# Patient Record
Sex: Male | Born: 1935 | Race: White | Hispanic: No | Marital: Married | Smoking: Never smoker
Health system: Southern US, Community
[De-identification: ages and names within clinical notes are randomized; demographics above are authoritative.]

## PROBLEM LIST (undated history)

## (undated) DIAGNOSIS — E119 Type 2 diabetes mellitus without complications: Secondary | ICD-10-CM

## (undated) DIAGNOSIS — I10 Essential (primary) hypertension: Secondary | ICD-10-CM

## (undated) HISTORY — PX: OTHER SURGICAL HISTORY: SHX169

---

## 2005-07-12 ENCOUNTER — Ambulatory Visit: Payer: Self-pay | Admitting: Internal Medicine

## 2005-07-29 ENCOUNTER — Ambulatory Visit: Payer: Self-pay | Admitting: Physician Assistant

## 2005-08-10 ENCOUNTER — Emergency Department: Payer: Self-pay | Admitting: Emergency Medicine

## 2006-07-01 ENCOUNTER — Ambulatory Visit: Payer: Self-pay | Admitting: Gastroenterology

## 2009-04-08 ENCOUNTER — Inpatient Hospital Stay: Payer: Self-pay | Admitting: Internal Medicine

## 2009-05-10 ENCOUNTER — Ambulatory Visit: Payer: Self-pay | Admitting: Internal Medicine

## 2009-06-27 ENCOUNTER — Ambulatory Visit: Payer: Self-pay | Admitting: Internal Medicine

## 2009-07-05 ENCOUNTER — Inpatient Hospital Stay: Payer: Self-pay | Admitting: Internal Medicine

## 2009-07-07 ENCOUNTER — Inpatient Hospital Stay: Payer: Self-pay | Admitting: Internal Medicine

## 2009-07-25 ENCOUNTER — Ambulatory Visit: Payer: Self-pay | Admitting: Internal Medicine

## 2011-01-25 ENCOUNTER — Ambulatory Visit: Payer: Self-pay | Admitting: Vascular Surgery

## 2011-02-21 ENCOUNTER — Inpatient Hospital Stay: Payer: Self-pay | Admitting: Vascular Surgery

## 2011-02-24 ENCOUNTER — Emergency Department: Payer: Self-pay | Admitting: Internal Medicine

## 2011-04-08 ENCOUNTER — Emergency Department: Payer: Self-pay | Admitting: Emergency Medicine

## 2013-03-02 ENCOUNTER — Ambulatory Visit: Payer: Self-pay | Admitting: Internal Medicine

## 2014-07-05 ENCOUNTER — Emergency Department: Payer: Self-pay | Admitting: Emergency Medicine

## 2014-12-19 ENCOUNTER — Emergency Department
Admission: EM | Admit: 2014-12-19 | Discharge: 2014-12-19 | Disposition: A | Discharge status: Home / Self Care | Payer: Medicare Other | Attending: Emergency Medicine | Admitting: Emergency Medicine

## 2014-12-19 ENCOUNTER — Encounter: Payer: Self-pay | Admitting: Emergency Medicine

## 2014-12-19 ENCOUNTER — Emergency Department: Payer: Medicare Other

## 2014-12-19 DIAGNOSIS — Y9289 Other specified places as the place of occurrence of the external cause: Secondary | ICD-10-CM | POA: Insufficient documentation

## 2014-12-19 DIAGNOSIS — S01111A Laceration without foreign body of right eyelid and periocular area, initial encounter: Secondary | ICD-10-CM | POA: Diagnosis not present

## 2014-12-19 DIAGNOSIS — S0990XA Unspecified injury of head, initial encounter: Secondary | ICD-10-CM | POA: Diagnosis present

## 2014-12-19 DIAGNOSIS — W01198A Fall on same level from slipping, tripping and stumbling with subsequent striking against other object, initial encounter: Secondary | ICD-10-CM | POA: Insufficient documentation

## 2014-12-19 DIAGNOSIS — S022XXA Fracture of nasal bones, initial encounter for closed fracture: Secondary | ICD-10-CM | POA: Insufficient documentation

## 2014-12-19 DIAGNOSIS — Y9389 Activity, other specified: Secondary | ICD-10-CM | POA: Diagnosis not present

## 2014-12-19 DIAGNOSIS — Y998 Other external cause status: Secondary | ICD-10-CM | POA: Insufficient documentation

## 2014-12-19 DIAGNOSIS — IMO0002 Reserved for concepts with insufficient information to code with codable children: Secondary | ICD-10-CM

## 2014-12-19 HISTORY — DX: Type 2 diabetes mellitus without complications: E11.9

## 2014-12-19 HISTORY — DX: Essential (primary) hypertension: I10

## 2014-12-19 MED ORDER — OXYCODONE-ACETAMINOPHEN 5-325 MG PO TABS: 1.0000 | ORAL_TABLET | Freq: Four times a day (QID) | ORAL | Status: DC | PRN

## 2014-12-19 MED ORDER — LIDOCAINE-EPINEPHRINE (PF) 1 %-1:200000 IJ SOLN
INTRAMUSCULAR | Status: AC
  Administered 2014-12-19: 30 mL
  Filled 2014-12-19: qty 30

## 2014-12-19 MED ORDER — ACETAMINOPHEN 500 MG PO TABS
1000.0000 mg | ORAL_TABLET | Freq: Once | ORAL | Status: AC
  Administered 2014-12-19: 1000 mg via ORAL
  Filled 2014-12-19: qty 2

## 2014-12-19 NOTE — ED Notes (Signed)
Pt states he was getting up from table and fell. Pt has laceration across nose and above right eye. Pt does complain of neck pain. Pt denies dizziness, or LOC.

## 2014-12-19 NOTE — Discharge Instructions (Signed)
Head Injury °You have received a head injury. It does not appear serious at this time. Headaches and vomiting are common following head injury. It should be easy to awaken from sleeping. Sometimes it is necessary for you to stay in the emergency department for a while for observation. Sometimes admission to the hospital may be needed. After injuries such as yours, most problems occur within the first 24 hours, but side effects may occur up to 7-10 days after the injury. It is important for you to carefully monitor your condition and contact your health care provider or seek immediate medical care if there is a change in your condition. °WHAT ARE THE TYPES OF HEAD INJURIES? °Head injuries can be as minor as a bump. Some head injuries can be more severe. More severe head injuries include: °· A jarring injury to the brain (concussion). °· A bruise of the brain (contusion). This mean there is bleeding in the brain that can cause swelling. °· A cracked skull (skull fracture). °· Bleeding in the brain that collects, clots, and forms a bump (hematoma). °WHAT CAUSES A HEAD INJURY? °A serious head injury is most likely to happen to someone who is in a car wreck and is not wearing a seat belt. Other causes of major head injuries include bicycle or motorcycle accidents, sports injuries, and falls. °HOW ARE HEAD INJURIES DIAGNOSED? °A complete history of the event leading to the injury and your current symptoms will be helpful in diagnosing head injuries. Many times, pictures of the brain, such as CT or MRI are needed to see the extent of the injury. Often, an overnight hospital stay is necessary for observation.  °WHEN SHOULD I SEEK IMMEDIATE MEDICAL CARE?  °You should get help right away if: °· You have confusion or drowsiness. °· You feel sick to your stomach (nauseous) or have continued, forceful vomiting. °· You have dizziness or unsteadiness that is getting worse. °· You have severe, continued headaches not relieved by  medicine. Only take over-the-counter or prescription medicines for pain, fever, or discomfort as directed by your health care provider. °· You do not have normal function of the arms or legs or are unable to walk. °· You notice changes in the black spots in the center of the colored part of your eye (pupil). °· You have a clear or bloody fluid coming from your nose or ears. °· You have a loss of vision. °During the next 24 hours after the injury, you must stay with someone who can watch you for the warning signs. This person should contact local emergency services (911 in the [place] if you have seizures, you become unconscious, or you are unable to wake up. °HOW CAN I PREVENT A HEAD INJURY IN THE FUTURE? °The most important factor for preventing major head injuries is avoiding motor vehicle accidents.  To minimize the potential for damage to your head, it is crucial to wear seat belts while riding in motor vehicles. Wearing helmets while bike riding and playing collision sports (like football) is also helpful. Also, avoiding dangerous activities around the house will further help reduce your risk of head injury.  °WHEN CAN I RETURN TO NORMAL ACTIVITIES AND ATHLETICS? °You should be reevaluated by your health care provider before returning to these activities. If you have any of the following symptoms, you should not return to activities or contact sports until 1 week after the symptoms have stopped: °· Persistent headache. °· Dizziness or vertigo. °· Poor attention and concentration. °· Confusion. °·   Memory problems.  Nausea or vomiting.  Fatigue or tire easily.  Irritability.  Intolerant of bright lights or loud noises.  Anxiety or depression.  Disturbed sleep. MAKE SURE YOU:   Understand these instructions.  Will watch your condition.  Will get help right away if you are not doing well or get worse. Document Released: 05/13/2005 Document Revised: 05/18/2013 Document Reviewed:  01/18/2013 [facility] Patient Information 2015 [place], [place]. This information is not intended to replace advice given to you by your health care provider. Make sure you discuss any questions you have with your health care provider.  Laceration Care, Adult A laceration is a cut that goes through all layers of the skin. The cut goes into the tissue beneath the skin. HOME CARE For stitches (sutures) or staples:  Keep the cut clean and dry.  If you have a bandage (dressing), change it at least once a day. Change the bandage if it gets wet or dirty, or as told by your doctor.  Wash the cut with soap and water 2 times a day. Rinse the cut with water. Pat it dry with a clean towel.  Put a thin layer of medicated cream on the cut as told by your doctor.  You may shower after the first 24 hours. Do not soak the cut in water until the stitches are removed.  Only take medicines as told by your doctor.  Have your stitches or staples removed as told by your doctor. For skin adhesive strips:  Keep the cut clean and dry.  Do not get the strips wet. You may take a bath, but be careful to keep the cut dry.  If the cut gets wet, pat it dry with a clean towel.  The strips will fall off on their own. Do not remove the strips that are still stuck to the cut. For wound glue:  You may shower or take baths. Do not soak or scrub the cut. Do not swim. Avoid heavy sweating until the glue falls off on its own. After a shower or bath, pat the cut dry with a clean towel.  Do not put medicine on your cut until the glue falls off.  If you have a bandage, do not put tape over the glue.  Avoid lots of sunlight or tanning lamps until the glue falls off. Put sunscreen on the cut for the first year to reduce your scar.  The glue will fall off on its own. Do not pick at the glue. You may need a tetanus shot if:  You cannot remember when you had your last tetanus shot.  You have never had a tetanus shot. If  you need a tetanus shot and you choose not to have one, you may get tetanus. Sickness from tetanus can be serious. GET HELP RIGHT AWAY IF:   Your pain does not get better with medicine.  Your arm, hand, leg, or foot loses feeling (numbness) or changes color.  Your cut is bleeding.  Your joint feels weak, or you cannot use your joint.  You have painful lumps on your body.  Your cut is red, puffy (swollen), or painful.  You have a red line on the skin near the cut.  You have yellowish-white fluid (pus) coming from the cut.  You have a fever.  You have a bad smell coming from the cut or bandage.  Your cut breaks open before or after stitches are removed.  You notice something coming out of the cut, such as  wood or glass.  You cannot move a finger or toe. MAKE SURE YOU:   Understand these instructions.  Will watch your condition.  Will get help right away if you are not doing well or get worse. Document Released: 10/30/2007 Document Revised: 08/05/2011 Document Reviewed: 11/06/2010 [facility] Patient Information 2015 [place], [place]. This information is not intended to replace advice given to you by your health care provider. Make sure you discuss any questions you have with your health care provider.  Nasal Fracture A nasal fracture is a break or crack in the bones of the nose. A minor break usually heals in a month. You often will receive black eyes from a nasal fracture. This is not a cause for concern. The black eyes will go away over 1 to 2 weeks.  DIAGNOSIS  Your caregiver may want to examine you if you are concerned about a fracture of the nose. X-rays of the nose may not show a nasal fracture even when one is present. Sometimes your caregiver must wait 1 to 5 days after the injury to re-check the nose for alignment and to take additional X-rays. Sometimes the caregiver must wait until the swelling has gone down. TREATMENT Minor fractures that have caused no deformity  often do not require treatment. More serious fractures where bones are displaced may require surgery. This will take place after the swelling is gone. Surgery will stabilize and align the fracture. HOME CARE INSTRUCTIONS   Put ice on the injured area.  Put ice in a plastic bag.  Place a towel between your skin and the bag.  Leave the ice on for 15-20 minutes, 03-04 times a day.  Take medications as directed by your caregiver.  Only take over-the-counter or prescription medicines for pain, discomfort, or fever as directed by your caregiver.  If your nose starts bleeding, squeeze the soft parts of the nose against the center wall while you are sitting in an upright position for 10 minutes.  Contact sports should be avoided for at least 3 to 4 weeks or as directed by your caregiver. SEEK MEDICAL CARE IF:  Your pain increases or becomes severe.  You continue to have nosebleeds.  The shape of your nose does not return to normal within 5 days.  You have pus draining from the nose. SEEK IMMEDIATE MEDICAL CARE IF:   You have bleeding from your nose that does not stop after 20 minutes of pinching the nostrils closed and keeping ice on the nose.  You have clear fluid draining from your nose.  You notice a grape-like swelling on the dividing wall between the nostrils (septum). This is a collection of blood (hematoma) that must be drained to help prevent infection.  You have difficulty moving your eyes.  You have recurrent vomiting. Document Released: 05/10/2000 Document Revised: 08/05/2011 Document Reviewed: 08/27/2010 [facility] Patient Information 2015 [place], [place]. This information is not intended to replace advice given to you by your health care provider. Make sure you discuss any questions you have with your health care provider.

## 2014-12-19 NOTE — ED Notes (Signed)
Cleaned wounds and dried blood on pt's face. Small laceration noted across bridge of nose, bleeding controlled. Sutures intact above right eye.

## 2014-12-19 NOTE — ED Provider Notes (Signed)
[facility] Emergency Department Provider Note     Time seen: ----------------------------------------- 7:34 PM on 12/19/2014 -----------------------------------------    I have reviewed the triage vital signs and the nursing notes.   HISTORY  Chief Complaint No chief complaint on file.    HPI Guy Hudson is a 81 y.o. male who presents ER for fall and head injury. States he was getting up from the table and fell hitting the washing machine. He has laceration noted across the nasal bridge and swelling about the right eye with laceration there as well. Patient does claim some neck pain. According to reports he states he fell and his glasses were driven into his foreheadand nose. Complaining of pain in the right supraorbital area and pain across the nasal bridge.   No past medical history on file.  There are no active problems to display for this patient.   No past surgical history on file.  Allergies Review of patient's allergies indicates not on file.  Social History History  Substance Use Topics  . Smoking status: Not on file  . Smokeless tobacco: Not on file  . Alcohol Use: Not on file    Review of Systems Constitutional: Negative for fever. Eyes: Negative for visual changes. ENT: Negative for sore throat. Cardiovascular: Negative for chest pain. Respiratory: Negative for shortness of breath. Gastrointestinal: Negative for abdominal pain, vomiting and diarrhea. Genitourinary: Negative for dysuria. Musculoskeletal: Negative for back pain. Positive for neck pain Skin: Negative for rash. Neurological: Positive for headache status post fall  10-point ROS otherwise negative.  ____________________________________________   PHYSICAL EXAM:  VITAL SIGNS: ED Triage Vitals  Enc Vitals Group     BP --      Pulse --      Resp --      Temp --      Temp src --      SpO2 --      Weight --      Height --      Head Cir --      Peak  Flow --      Pain Score --      Pain Loc --      Pain Edu? --      Excl. in [place]? --     Constitutional: Alert and oriented. Well appearing and in no distress. Eyes: Conjunctivae are normal. PERRL. Normal extraocular movements. ENT   Head: 3.5 similar laceration through the right eyebrow.   Nose: No congestion/rhinnorhea. Nose is swollen, no septal hematoma. Superficial laceration is noted over the nasal bridge just to the right of midline   Mouth/Throat: Mucous membranes are moist.   Neck: No stridor. Cardiovascular: Normal rate, regular rhythm. Normal and symmetric distal pulses are present in all extremities. No murmurs, rubs, or gallops. Respiratory: Normal respiratory effort without tachypnea nor retractions. Breath sounds are clear and equal bilaterally. No wheezes/rales/rhonchi. Gastrointestinal: Soft and nontender. No distention. Musculoskeletal: Nontender with normal range of motion in all extremities. No joint effusions.  No lower extremity tenderness nor edema. Neurologic:  Normal speech and language. No gross focal neurologic deficits are appreciated.  Skin: Laceration noted through right eyebrow as above, ecchymosis and superficial laceration noted around the nose Psychiatric: Mood and affect are normal.  ____________________________________________  ED COURSE:  Pertinent labs & imaging results that were available during my care of the patient were reviewed by me and considered in my medical decision making (see chart for details). Fall with facial injury and laceration.  Will need wound repair as well as CT scans ____________________________________________  RADIOLOGY Images were viewed by me  CT head, C-spine are unremarkable FINDINGS: There is a displaced distal nasal bone fracture. Patient is edentulous. Sinuses are clear.  IMPRESSION: Displaced nasal bone fracture.   LACERATION REPAIR Performed by: Earleen Newport Authorized by: Lenise Arena E Consent: Verbal consent obtained. Risks and benefits: risks, benefits and alternatives were discussed Consent given by: patient Patient identity confirmed: provided demographic data Prepped and Draped in normal sterile fashion Wound explored  Laceration Location: Right eyebrow  Laceration Length: 3.5 cm  No Foreign Bodies seen or palpated  Anesthesia: local infiltration  Local anesthetic: lidocaine 1 % with epinephrine  Anesthetic total: 3 ml  Irrigation method: syringe Amount of cleaning: standard  Skin closure: 5-0 Ethilon   Number of sutures: 11   Technique: Running   Patient tolerance: Patient tolerated the procedure well with no immediate complications. ____________________________________________  FINAL ASSESSMENT AND PLAN  Fall, head injury, frontal scalp laceration, nasal fracture  Plan: Patient with labs and imaging as dictated above. Patient be encouraged to use ice pack 15 minutes every hour for the next 24 hours. We'll discharge with pain medication and ENT follow-up. Sutures need to be removed in 7 days.   Earleen Newport, MD   Earleen Newport, MD 12/19/14 [phone]

## 2016-04-08 ENCOUNTER — Other Ambulatory Visit (INDEPENDENT_AMBULATORY_CARE_PROVIDER_SITE_OTHER): Payer: Self-pay | Admitting: Vascular Surgery

## 2016-04-08 DIAGNOSIS — I739 Peripheral vascular disease, unspecified: Secondary | ICD-10-CM

## 2016-04-08 DIAGNOSIS — I6523 Occlusion and stenosis of bilateral carotid arteries: Secondary | ICD-10-CM

## 2016-04-08 DIAGNOSIS — M79605 Pain in left leg: Secondary | ICD-10-CM

## 2016-04-08 DIAGNOSIS — M79604 Pain in right leg: Secondary | ICD-10-CM

## 2016-04-11 ENCOUNTER — Encounter (INDEPENDENT_AMBULATORY_CARE_PROVIDER_SITE_OTHER): Payer: Medicare Other

## 2016-04-11 ENCOUNTER — Ambulatory Visit (INDEPENDENT_AMBULATORY_CARE_PROVIDER_SITE_OTHER): Payer: Medicare Other | Admitting: Vascular Surgery

## 2016-05-12 ENCOUNTER — Emergency Department
Admission: EM | Admit: 2016-05-12 | Discharge: 2016-05-12 | Disposition: A | Discharge status: Home / Self Care | Payer: Medicare Other | Attending: Emergency Medicine | Admitting: Emergency Medicine

## 2016-05-12 ENCOUNTER — Emergency Department: Payer: Medicare Other

## 2016-05-12 DIAGNOSIS — Z794 Long term (current) use of insulin: Secondary | ICD-10-CM | POA: Diagnosis not present

## 2016-05-12 DIAGNOSIS — E119 Type 2 diabetes mellitus without complications: Secondary | ICD-10-CM | POA: Diagnosis not present

## 2016-05-12 DIAGNOSIS — Z79899 Other long term (current) drug therapy: Secondary | ICD-10-CM | POA: Diagnosis not present

## 2016-05-12 DIAGNOSIS — I10 Essential (primary) hypertension: Secondary | ICD-10-CM | POA: Insufficient documentation

## 2016-05-12 DIAGNOSIS — Z7982 Long term (current) use of aspirin: Secondary | ICD-10-CM | POA: Diagnosis not present

## 2016-05-12 DIAGNOSIS — K5900 Constipation, unspecified: Secondary | ICD-10-CM | POA: Diagnosis present

## 2016-05-12 LAB — COMPREHENSIVE METABOLIC PANEL
ALK PHOS: 852 U/L — AB (ref 38–126)
ALT: 15 U/L — ABNORMAL LOW (ref 17–63)
AST: 25 U/L (ref 15–41)
Albumin: 3.7 g/dL (ref 3.5–5.0)
Anion gap: 6 (ref 5–15)
BILIRUBIN TOTAL: 0.7 mg/dL (ref 0.3–1.2)
BUN: 32 mg/dL — AB (ref 6–20)
CALCIUM: 8.6 mg/dL — AB (ref 8.9–10.3)
CO2: 28 mmol/L (ref 22–32)
CREATININE: 2.36 mg/dL — AB (ref 0.61–1.24)
Chloride: 104 mmol/L (ref 101–111)
GFR calc Af Amer: 26 mL/min — ABNORMAL LOW (ref >60)
GFR calc non Af Amer: 23 mL/min — ABNORMAL LOW (ref >60)
Glucose, Bld: 132 mg/dL — ABNORMAL HIGH (ref 65–99)
Potassium: 4.4 mmol/L (ref 3.5–5.1)
Sodium: 138 mmol/L (ref 135–145)
Total Protein: 7.4 g/dL (ref 6.5–8.1)

## 2016-05-12 LAB — CBC WITH DIFFERENTIAL/PLATELET
BASOS ABS: 0 10*3/uL (ref 0–0.1)
Basophils Relative: 1 %
Eosinophils Absolute: 0.4 10*3/uL (ref 0–0.7)
Eosinophils Relative: 6 %
HEMATOCRIT: 32.1 % — AB (ref 40.0–52.0)
Hemoglobin: 10.9 g/dL — ABNORMAL LOW (ref 13.0–18.0)
Lymphocytes Relative: 16 %
Lymphs Abs: 1 10*3/uL (ref 1.0–3.6)
MCH: 31.4 pg (ref 26.0–34.0)
MCHC: 34 g/dL (ref 32.0–36.0)
MCV: 92.3 fL (ref 80.0–100.0)
Monocytes Absolute: 0.7 10*3/uL (ref 0.2–1.0)
Monocytes Relative: 11 %
NEUTROS ABS: 4.4 10*3/uL (ref 1.4–6.5)
Neutrophils Relative %: 66 %
Platelets: 184 10*3/uL (ref 150–440)
RBC: 3.47 MIL/uL — ABNORMAL LOW (ref 4.40–5.90)
RDW: 12.8 % (ref 11.5–14.5)
WBC: 6.6 10*3/uL (ref 3.8–10.6)

## 2016-05-12 LAB — GLUCOSE, CAPILLARY: Glucose-Capillary: 101 mg/dL — ABNORMAL HIGH (ref 65–99)

## 2016-05-12 MED ORDER — SORBITOL 70 % SOLN
960.0000 mL | TOPICAL_OIL | Freq: Once | ORAL | Status: DC
  Filled 2016-05-12: qty 240

## 2016-05-12 MED ORDER — SODIUM CHLORIDE 0.9 % IV BOLUS (SEPSIS)
500.0000 mL | Freq: Once | INTRAVENOUS | Status: AC
  Administered 2016-05-12: 500 mL via INTRAVENOUS

## 2016-05-12 MED ORDER — POLYETHYLENE GLYCOL 3350 17 G PO PACK: 17.0000 g | PACK | Freq: Every day | ORAL | 0 refills | Status: DC

## 2016-05-12 NOTE — ED Notes (Signed)
Soap suds enema completed.

## 2016-05-12 NOTE — ED Triage Notes (Signed)
Pt came to ED via EMS from home c/o constipation for 2-3 days. Seen by PCP, given laxatives but pt reports nothing is working.Denies pain.

## 2016-05-12 NOTE — Discharge Instructions (Signed)
Please return immediately if condition worsens. Please contact her primary physician or the physician you were given for referral. If you have any specialist physicians involved in her treatment and plan please also contact them. Thank you for using [place] [facility]. ° °

## 2016-05-12 NOTE — ED Notes (Signed)
Pt given snack and juice.

## 2016-05-12 NOTE — ED Notes (Signed)
Pt had large BM and urinated without difficulty.

## 2016-05-12 NOTE — ED Provider Notes (Signed)
Time Seen: Approximately [id number]  I have reviewed the triage notes  Chief Complaint: Constipation   History of Present Illness: Guy Hudson is a 82 y.o. male *who presents with a 2 day history of constipation. Patient was transported here by EMS. He has no pain at this time. He took some laxatives at home without a bowel movement. He states he's been urinating normally without difficulty initiating order him stopping his urination. He denies any weakness in either upper or lower extremities. No fever at home. Past Medical History:  Diagnosis Date  . Diabetes mellitus without complication ([place])   . Hypertension     There are no active problems to display for this patient.   History reviewed. No pertinent surgical history.  History reviewed. No pertinent surgical history.  Current Outpatient Rx  . Order #: [id number] Class: Historical Med  . Order #: [id number] Class: Historical Med  . Order #: [id number] Class: Historical Med  . Order #: [id number] Class: Historical Med  . Order #: [id number] Class: Historical Med  . Order #: [id number] Class: Historical Med  . Order #: [id number] Class: Historical Med  . Order #: [id number] Class: Print  . Order #: [id number] Class: Historical Med  . Order #: [id number] Class: Historical Med    Allergies:  Patient has no known allergies.  Family History: No family history on file.  Social History: Social History  Substance Use Topics  . Smoking status: Never Smoker  . Smokeless tobacco: Not on file  . Alcohol use No     Review of Systems:   10 point review of systems was performed and was otherwise negative:  Constitutional: No fever Eyes: No visual disturbances ENT: No sore throat, ear pain Cardiac: No chest pain Respiratory: No shortness of breath, wheezing, or stridor Abdomen: No abdominal pain, no vomiting, No diarrhea Constipation with urgency to have a bowel movement but none successful Endocrine: No weight loss, No night  sweats Extremities: No peripheral edema, cyanosis Skin: No rashes, easy bruising Neurologic: No focal weakness, trouble with speech or swollowing Urologic: No dysuria, Hematuria, or urinary frequency   Physical Exam:  ED Triage Vitals [05/12/16 1034]  Enc Vitals Group     BP (!) 169/62     Pulse Rate 83     Resp      Temp 99.1 F (37.3 C)     Temp Source Oral     SpO2 99 %     Weight      Height      Head Circumference      Peak Flow      Pain Score      Pain Loc      Pain Edu?      Excl. in [place]?     General: Awake , Alert , and Oriented times 3; GCS 15 Head: Normal cephalic , atraumatic Eyes: Pupils equal , round, reactive to light Nose/Throat: No nasal drainage, patent upper airway without erythema or exudate.  Neck: Supple, Full range of motion, No anterior adenopathy or palpable thyroid masses Lungs: Clear to ascultation without wheezes , rhonchi, or rales Heart: Regular rate, regular rhythm without murmurs , gallops , or rubs Abdomen: Soft, non tender without rebound, guarding , or rigidity; bowel sounds positive and symmetric in all 4 quadrants. No organomegaly .        Extremities: 2 plus symmetric pulses. No edema, clubbing or cyanosis Neurologic: normal ambulation, Motor symmetric without deficits, sensory intact Skin: warm, dry, no rashes   Labs:   All laboratory work  was reviewed including any pertinent negatives or positives listed below:  Labs Reviewed  CBC WITH DIFFERENTIAL/PLATELET  COMPREHENSIVE METABOLIC PANEL  Patient has some elevated creatinine level. He also has an elevated alkaline phosphatase level.  Radiology: * "Dg Abd Acute W/chest  Result Date: 05/12/2016 CLINICAL DATA:  Constipation for the past 2 days. EXAM: DG ABDOMEN ACUTE W/ 1V CHEST COMPARISON:  Chest radiographs dated 07/10/2009. Abdomen and pelvis CT dated 07/07/2009. Pelvis CT dated 04/08/2011. FINDINGS: The cardiac silhouette remains borderline enlarged. Stable mild linear  scarring at the right lateral lung base. Mild biapical pleural and parenchymal scarring. Otherwise, clear lungs. Normal bowel gas pattern without free peritoneal air. Interval 1.9 cm rounded sclerotic focus in the right iliac wing. There is also interval sclerosis of the L1 vertebral body and ill-defined sclerosis in the left superior acetabular region of the left iliac bone. Also noted are lumbar and lower thoracic spine degenerative changes. IMPRESSION: 1. Interval sclerotic bone lesions, suspicious for sclerotic metastatic disease. This can be seen with prostate cancer. 2. No acute abnormality. Electronically Signed   By: Claudie Revering M.D.   On: 05/12/2016 11:31  "  I personally reviewed the radiologic studies    ED Course:  Patient's stay here was uneventful and showed symptomatic improvement with a soapsuds enema. He had a large bowel movement without any blood, etc. I reviewed his laboratory work with his family is at the bedside and apparently his renal function decline is noted by the primary physician and the family states he is "" watching it "". Patient also has some findings per x-ray and laboratory for concerns for prostate cancer. Family states that this is also been reviewed with him and patient is not going to likely have any aggressive management. He was given some IV fluids here which I felt may help him with the constipation and he was discharged home with his family with close follow-up with his primary physician who seems to be aware of most of his current medical issues. I placed the patient on MiraLAX on a daily basis as an outpatient. Clinical Course      Assessment:  Acute nonneurologic constipation Renal insufficiency Likely prostate cancer      Plan:  Outpatient \" New Prescriptions   POLYETHYLENE GLYCOL (MIRALAX) PACKET    Take 17 g by mouth daily.  " Patient was advised to return immediately if condition worsens. Patient was advised to follow up with their  primary care physician or other specialized physicians involved in their outpatient care. The patient and/or family member/power of attorney had laboratory results reviewed at the bedside. All questions and concerns were addressed and appropriate discharge instructions were distributed by the nursing staff.            Daymon Larsen, MD 05/12/16 [phone]

## 2016-05-30 ENCOUNTER — Emergency Department: Payer: Medicare Other

## 2016-05-30 ENCOUNTER — Encounter: Payer: Self-pay | Admitting: Emergency Medicine

## 2016-05-30 ENCOUNTER — Inpatient Hospital Stay
Admission: EM | Admit: 2016-05-30 | Discharge: 2016-06-07 | DRG: 871 | Disposition: A | Discharge status: Hospice | Payer: Medicare Other | Attending: Internal Medicine | Admitting: Internal Medicine

## 2016-05-30 DIAGNOSIS — R4182 Altered mental status, unspecified: Secondary | ICD-10-CM | POA: Diagnosis not present

## 2016-05-30 DIAGNOSIS — A419 Sepsis, unspecified organism: Secondary | ICD-10-CM | POA: Diagnosis present

## 2016-05-30 DIAGNOSIS — Z7189 Other specified counseling: Secondary | ICD-10-CM

## 2016-05-30 DIAGNOSIS — Z79899 Other long term (current) drug therapy: Secondary | ICD-10-CM

## 2016-05-30 DIAGNOSIS — Z794 Long term (current) use of insulin: Secondary | ICD-10-CM

## 2016-05-30 DIAGNOSIS — E785 Hyperlipidemia, unspecified: Secondary | ICD-10-CM | POA: Diagnosis present

## 2016-05-30 DIAGNOSIS — Z79891 Long term (current) use of opiate analgesic: Secondary | ICD-10-CM

## 2016-05-30 DIAGNOSIS — R6520 Severe sepsis without septic shock: Secondary | ICD-10-CM | POA: Diagnosis present

## 2016-05-30 DIAGNOSIS — Z8042 Family history of malignant neoplasm of prostate: Secondary | ICD-10-CM

## 2016-05-30 DIAGNOSIS — N400 Enlarged prostate without lower urinary tract symptoms: Secondary | ICD-10-CM | POA: Diagnosis present

## 2016-05-30 DIAGNOSIS — C61 Malignant neoplasm of prostate: Secondary | ICD-10-CM

## 2016-05-30 DIAGNOSIS — R509 Fever, unspecified: Secondary | ICD-10-CM

## 2016-05-30 DIAGNOSIS — N189 Chronic kidney disease, unspecified: Secondary | ICD-10-CM | POA: Diagnosis present

## 2016-05-30 DIAGNOSIS — N419 Inflammatory disease of prostate, unspecified: Secondary | ICD-10-CM | POA: Diagnosis present

## 2016-05-30 DIAGNOSIS — M549 Dorsalgia, unspecified: Secondary | ICD-10-CM

## 2016-05-30 DIAGNOSIS — L89152 Pressure ulcer of sacral region, stage 2: Secondary | ICD-10-CM

## 2016-05-30 DIAGNOSIS — Z515 Encounter for palliative care: Secondary | ICD-10-CM

## 2016-05-30 DIAGNOSIS — E1122 Type 2 diabetes mellitus with diabetic chronic kidney disease: Secondary | ICD-10-CM | POA: Diagnosis present

## 2016-05-30 DIAGNOSIS — Z7982 Long term (current) use of aspirin: Secondary | ICD-10-CM

## 2016-05-30 DIAGNOSIS — N179 Acute kidney failure, unspecified: Secondary | ICD-10-CM | POA: Diagnosis present

## 2016-05-30 DIAGNOSIS — R2681 Unsteadiness on feet: Secondary | ICD-10-CM

## 2016-05-30 DIAGNOSIS — I129 Hypertensive chronic kidney disease with stage 1 through stage 4 chronic kidney disease, or unspecified chronic kidney disease: Secondary | ICD-10-CM | POA: Diagnosis present

## 2016-05-30 DIAGNOSIS — C7951 Secondary malignant neoplasm of bone: Secondary | ICD-10-CM | POA: Diagnosis present

## 2016-05-30 DIAGNOSIS — R310 Gross hematuria: Secondary | ICD-10-CM

## 2016-05-30 DIAGNOSIS — Z66 Do not resuscitate: Secondary | ICD-10-CM

## 2016-05-30 DIAGNOSIS — B349 Viral infection, unspecified: Secondary | ICD-10-CM | POA: Diagnosis present

## 2016-05-30 DIAGNOSIS — K5900 Constipation, unspecified: Secondary | ICD-10-CM | POA: Diagnosis present

## 2016-05-30 DIAGNOSIS — L8990 Pressure ulcer of unspecified site, unspecified stage: Secondary | ICD-10-CM | POA: Insufficient documentation

## 2016-05-30 DIAGNOSIS — R0902 Hypoxemia: Secondary | ICD-10-CM | POA: Diagnosis present

## 2016-05-30 DIAGNOSIS — E860 Dehydration: Secondary | ICD-10-CM | POA: Diagnosis present

## 2016-05-30 DIAGNOSIS — G9341 Metabolic encephalopathy: Secondary | ICD-10-CM | POA: Diagnosis present

## 2016-05-30 LAB — CBC
HCT: 33 % — ABNORMAL LOW (ref 40.0–52.0)
Hemoglobin: 11.3 g/dL — ABNORMAL LOW (ref 13.0–18.0)
MCH: 31.9 pg (ref 26.0–34.0)
MCHC: 34.2 g/dL (ref 32.0–36.0)
MCV: 93.3 fL (ref 80.0–100.0)
PLATELETS: 142 10*3/uL — AB (ref 150–440)
RBC: 3.53 MIL/uL — ABNORMAL LOW (ref 4.40–5.90)
RDW: 13.5 % (ref 11.5–14.5)
WBC: 8.3 10*3/uL (ref 3.8–10.6)

## 2016-05-30 MED ORDER — LORAZEPAM 2 MG/ML IJ SOLN
INTRAMUSCULAR | Status: AC
  Administered 2016-05-30: 1 mg
  Filled 2016-05-30: qty 1

## 2016-05-30 MED ORDER — SODIUM CHLORIDE 0.9 % IV BOLUS (SEPSIS)
1000.0000 mL | Freq: Once | INTRAVENOUS | Status: AC
  Administered 2016-05-30: 1000 mL via INTRAVENOUS

## 2016-05-30 MED ORDER — VANCOMYCIN HCL IN DEXTROSE 1-5 GM/200ML-% IV SOLN
1000.0000 mg | Freq: Once | INTRAVENOUS | Status: AC
  Administered 2016-05-30: 1000 mg via INTRAVENOUS
  Filled 2016-05-30: qty 200

## 2016-05-30 MED ORDER — SODIUM CHLORIDE 0.9 % IV BOLUS (SEPSIS)
500.0000 mL | Freq: Once | INTRAVENOUS | Status: AC
  Administered 2016-05-31: 500 mL via INTRAVENOUS

## 2016-05-30 MED ORDER — ACETAMINOPHEN 650 MG RE SUPP
RECTAL | Status: AC
  Administered 2016-05-30: 650 mg
  Filled 2016-05-30: qty 1

## 2016-05-30 MED ORDER — ACETAMINOPHEN 650 MG RE SUPP
650.0000 mg | Freq: Once | RECTAL | Status: AC
  Administered 2016-05-30: 650 mg via RECTAL

## 2016-05-30 MED ORDER — PIPERACILLIN-TAZOBACTAM 3.375 G IVPB 30 MIN
3.3750 g | Freq: Once | INTRAVENOUS | Status: AC
  Administered 2016-05-30: 3.375 g via INTRAVENOUS
  Filled 2016-05-30: qty 50

## 2016-05-30 NOTE — ED Notes (Signed)
Pt brought in via ems from home with altered mental status.  Pt yelling out on arrival to room.  Pt has fever of 103 on arrival  Code sepsis called by dr brown.  2 iv's started stat ,foley cath inserted and meds given.

## 2016-05-30 NOTE — Progress Notes (Signed)
Pharmacy Antibiotic Note  Guy Hudson is a 83 y.o. male admitted on 05/30/2016 with sepsis.  Pharmacy has been consulted for Zosyn and vancomycin dosing.  Plan: 1. Zosyn 3.375 gm IV Q8H EI 2. Vancomycin 1 gm IV x 1 in ED followed in approximately 12 hours (stacked dosing) by vancomycin 1 gm IV Q36H, predicted trough 15 mcg/mL. Pharmacy will continue to follow and adjust as needed to maintain trough 15 to 20 mcg/mL.   Vd 53.5 L, Ke 0.022 hr-1, T1/2 31.2 hr  Height: 5\' 10"  (177.8 cm) Weight: 180 lb (81.6 kg) IBW/kg (Calculated) : 73  Temp (24hrs), Avg:103.3 F (39.6 C), Min:103.3 F (39.6 C), Max:103.3 F (39.6 C)  No results for input(s): WBC, CREATININE, LATICACIDVEN, VANCOTROUGH, VANCOPEAK, VANCORANDOM, GENTTROUGH, GENTPEAK, GENTRANDOM, TOBRATROUGH, TOBRAPEAK, TOBRARND, AMIKACINPEAK, AMIKACINTROU, AMIKACIN in the last 168 hours.  Estimated Creatinine Clearance: 21.5 mL/min (by C-G formula based on SCr of 2.36 mg/dL (H)).    No Known Allergies  Thank you for allowing pharmacy to be a part of this patient's care.  Laural Benes, Pharm.D., BCPS Clinical Pharmacist 05/30/2016 11:35 PM

## 2016-05-30 NOTE — ED Notes (Signed)
Patient transported to CT 

## 2016-05-30 NOTE — ED Provider Notes (Signed)
[facility] Emergency Department Provider Note  ____________________________________________   First MD Initiated Contact with Patient 05/30/16 2316     (approximate)  I have reviewed the triage vital signs and the nursing notes.  History and physical exam limited SECONDARY to altered mental status. HISTORY  Chief Complaint Altered Mental Status   HPI Guy Hudson is a 83 y.o. male resents emergency department via EMS with altered mental status with abrupt onset approximately one hour ago per EMS staff. Able to obtain meaningful history from patient due to altered mental status. Of note patient found to be tachycardic, hypoxic febrile on arrival to the emergency department.   Past Medical History:  Diagnosis Date  . Diabetes mellitus without complication ([place])   . Hypertension     Patient Active Problem List   Diagnosis Date Noted  . Sepsis ([place]) 05/31/2016    Past Surgical History:  Procedure Laterality Date  . unknown      Prior to Admission medications   Medication Sig Start Date End Date Taking? Authorizing Provider  acetaminophen (TYLENOL) 500 MG tablet Take 500-1,000 mg by mouth every 6 (six) hours as needed for mild pain or headache.    Historical Provider, MD  aspirin EC 325 MG tablet Take 325 mg by mouth daily.    Historical Provider, MD  ferrous sulfate 325 (65 FE) MG tablet Take 325 mg by mouth 3 (three) times a week. Pt takes on Monday, Wednesday, and Friday.    Historical Provider, MD  furosemide (LASIX) 40 MG tablet Take 40 mg by mouth daily. Pt does not take on Monday, Wednesday, and Friday.    Historical Provider, MD  insulin aspart (NOVOLOG) 100 UNIT/ML injection Inject 8 Units into the skin 2 (two) times daily.    Historical Provider, MD  insulin detemir (LEVEMIR) 100 UNIT/ML injection Inject 35 Units into the skin 2 (two) times daily.    Historical Provider, MD  metoprolol (LOPRESSOR) 50 MG tablet Take 50 mg by mouth 2 (two)  times daily.    Historical Provider, MD  oxyCODONE-acetaminophen (ROXICET) 5-325 MG per tablet Take 1 tablet by mouth every 6 (six) hours as needed. 12/19/14   Earleen Newport, MD  polyethylene glycol [facility]) packet Take 17 g by mouth daily. 05/12/16   Daymon Larsen, MD  simvastatin (ZOCOR) 80 MG tablet Take 40 mg by mouth at bedtime.    Historical Provider, MD  terazosin (HYTRIN) 5 MG capsule Take 5 mg by mouth at bedtime.    Historical Provider, MD    Allergies Patient has no known allergies.  Family History  Problem Relation Age of Onset  . Family history unknown: Yes    Social History Social History  Substance Use Topics  . Smoking status: Never Smoker  . Smokeless tobacco: Never Used  . Alcohol use No    Review of Systems Constitutional: Positive for fever/chills Eyes: No visual changes. ENT: No sore throat. Cardiovascular: Denies chest pain. Respiratory: Denies shortness of breath. Gastrointestinal: No abdominal pain.  No nausea, no vomiting.  No diarrhea.  No constipation. Genitourinary: Negative for dysuria. Musculoskeletal: Negative for back pain. Skin: Negative for rash. Neurological: Negative for headaches, focal weakness or numbness.  10-point ROS otherwise negative.  ____________________________________________   PHYSICAL EXAM:  VITAL SIGNS: ED Triage Vitals  Enc Vitals Group     BP --      Pulse --      Resp --      Temp 05/30/16 2323 Marland Kitchen)  103.3 F (39.6 C)     Temp Source 05/30/16 2323 Rectal     SpO2 --      Weight 05/30/16 2316 180 lb (81.6 kg)     Height 05/30/16 2316 5\' 10"  (1.778 m)     Head Circumference --      Peak Flow --      Pain Score --      Pain Loc --      Pain Edu? --      Excl. in [place]? --    Constitutional: Agitated, combative.  Eyes: Conjunctivae are normal. PERRL. EOMI. Head: Atraumatic. Ears:  Healthy appearing ear canals and TMs bilaterally Nose: No congestion/rhinnorhea. Mouth/Throat: Mucous membranes are  moist.  Oropharynx non-erythematous. Neck: No stridor.   Cardiovascular: Normal rate, regular rhythm. Good peripheral circulation. Grossly normal heart sounds. Respiratory: Normal respiratory effort.  No retractions. Lungs CTAB. Gastrointestinal: Soft and nontender. No distention.  Musculoskeletal: No lower extremity tenderness nor edema. No gross deformities of extremities. Neurologic:  Nonsensical repetitive speech No gross focal neurologic deficits are appreciated.  Skin:  Skin is to touch. No rash noted. Psychiatric: Mood and affect are normal. Speech and behavior are normal.  ____________________________________________   LABS (all labs ordered are listed, but only abnormal results are displayed)  Labs Reviewed  BASIC METABOLIC PANEL - Abnormal; Notable for the following:       Result Value   Glucose, Bld 254 (*)    BUN 40 (*)    Creatinine, Ser 2.30 (*)    Calcium 8.2 (*)    GFR calc non Af Amer 23 (*)    GFR calc Af Amer 27 (*)    All other components within normal limits  CBC - Abnormal; Notable for the following:    RBC 3.53 (*)    Hemoglobin 11.3 (*)    HCT 33.0 (*)    Platelets 142 (*)    All other components within normal limits  TROPONIN I - Abnormal; Notable for the following:    Troponin I 0.03 (*)    All other components within normal limits  URINALYSIS, COMPLETE (UACMP) WITH MICROSCOPIC - Abnormal; Notable for the following:    Color, Urine YELLOW (*)    APPearance CLEAR (*)    Glucose, UA >=500 (*)    Protein, ur 30 (*)    All other components within normal limits  RAPID INFLUENZA A&B ANTIGENS ([facility] ONLY)  CULTURE, BLOOD (ROUTINE X 2)  CULTURE, BLOOD (ROUTINE X 2)  URINE CULTURE  LACTIC ACID, PLASMA  LACTIC ACID, PLASMA  BLOOD GAS, VENOUS  BASIC METABOLIC PANEL  CBC   ____________________________________________  EKG  ED ECG REPORT I, [place] Mack Alvidrez, the attending physician, personally viewed and interpreted this ECG.   Date: 05/30/2016   EKG Time: 11:27 PM  Rate: 106  Rhythm: Sinus tachycardia  Axis: Normal  Intervals: Normal  ST&T Change: None  ____________________________________________  RADIOLOGY I, [employer] N Jhamal Plucinski, personally viewed and evaluated these images (plain radiographs) as part of my medical decision making, as well as reviewing the written report by the radiologist.  Ct Head Wo Contrast  Result Date: 05/31/2016 CLINICAL DATA:  Altered mental status beginning at 2200 hours tonight after sneezing. Hyperglycemia. History of hypertension and diabetes. EXAM: CT HEAD WITHOUT CONTRAST TECHNIQUE: Contiguous axial images were obtained from the base of the skull through the vertex without intravenous contrast. COMPARISON:  CT HEAD December 19, 2014 FINDINGS: BRAIN: The ventricles and sulci are normal for age. No intraparenchymal  hemorrhage, mass effect nor midline shift. Patchy supratentorial white matter hypodensities less than expected for patient's age, though non-specific are most compatible with chronic small vessel ischemic disease. No acute large vascular territory infarcts. No abnormal extra-axial fluid collections. Basal cisterns are patent. VASCULAR: Mild to moderate calcific atherosclerosis of the carotid siphons. SKULL: No skull fracture. No significant scalp soft tissue swelling. SINUSES/ORBITS: The mastoid air-cells and included paranasal sinuses are well-aerated. Status post bilateral ocular lens implants. The included ocular globes and orbital contents are non-suspicious. OTHER: Patient is edentulous. IMPRESSION: Negative CT HEAD for age. Electronically Signed   By: Elon Alas M.D.   On: 05/31/2016 00:08   Ct Chest Wo Contrast  Result Date: 05/31/2016 CLINICAL DATA:  Hematuria. Altered mental status after sneezing tonight. Hyperglycemia. History of hypertension, diabetes. EXAM: CT CHEST, ABDOMEN AND PELVIS WITHOUT CONTRAST TECHNIQUE: Multidetector CT imaging of the chest, abdomen and pelvis was performed  following the standard protocol without IV contrast. COMPARISON:  Chest radiograph May 30, 2016 and acute abdomen series May 12, 2016 and CT pelvis April 08, 2011 FINDINGS: CT CHEST FINDINGS CARDIOVASCULAR: Heart size is normal. Mild coronary artery calcifications. No pericardial effusions. Thoracic aorta is normal course and caliber, mild calcific atherosclerosis of the aortic arch. MEDIASTINUM/NODES: No mediastinal mass. Scattered subcentimeter mediastinal lymph nodes without lymphadenopathy by CT size criteria. Normal appearance of thoracic esophagus though not tailored for evaluation. LUNGS/PLEURA: Tracheobronchial tree is patent, no pneumothorax. Mild bronchial wall thickening. No pleural effusions, focal consolidations, pulmonary nodules or masses. RIGHT upper lobe atelectasis MUSCULOSKELETAL: Scattered sclerotic lesions throughout the thoracic spine, most conspicuous within spinous process T2, manubrium, and ventral T9 and T10 vertebral bodies without pathologic fracture. Sclerotic lesion RIGHT T10 rib. Soft tissues are normal. CT ABDOMEN PELVIS FINDINGS HEPATOBILIARY: Punctate calcified granuloma, liver otherwise unremarkable. Normal gallbladder. PANCREAS: Normal. SPLEEN: Normal. ADRENALS/URINARY TRACT: Kidneys are orthotopic, demonstrating normal size and morphology. No nephrolithiasis, hydronephrosis; limited assessment for renal masses on this nonenhanced examination. 4.8 cm RIGHT interpolar cyst Tom RIGHT lower pole exophytic 16 mm cyst. Pedunculated 6.2 cm cyst exophytic from lower pole LEFT kidney. The unopacified ureters are normal in course and caliber. Urinary bladder is decompressed containing Foley catheter in retaining bulb. Normal adrenal glands. STOMACH/BOWEL: The stomach, small and large bowel are normal in course and caliber without inflammatory changes, sensitivity decreased by lack of enteric contrast. VASCULAR/LYMPHATIC: Aortoiliac vessels are normal in course and caliber, mild  calcific atherosclerosis. No lymphadenopathy by CT size criteria. REPRODUCTIVE: Prostatomegaly, 5.1 x 4.7 cm invades the base the urinary bladder. OTHER: No intraperitoneal free fluid or free air. MUSCULOSKELETAL: Patchy sclerosis bilateral iliac bones, RIGHT intertrochanteric femur, L1 ivory vertebral body with additional patchy sclerosis in the lumbosacral spine. No pathologic fracture. Severe L4-5 and L5-S1 degenerative discs. Moderate fat containing inguinal hernias. IMPRESSION: CT CHEST: Mild bronchial wall thickening associated with bronchitis or reactive airway disease without pneumonia. Sclerotic osseous metastasis without pathologic fracture, most commonly seen with prostate cancer. CT ABDOMEN AND PELVIS:  No acute abdominopelvic process. Prostatomegaly invading urinary bladder. Sclerotic osseous metastasis without pathologic fracture, most commonly seen with prostate cancer. Electronically Signed   By: Elon Alas M.D.   On: 05/31/2016 02:18   Dg Chest Port 1 View  Result Date: 05/31/2016 CLINICAL DATA:  Altered mental status beginning at 2200 hours after sneezing. Hyperglycemia. EXAM: PORTABLE CHEST 1 VIEW COMPARISON:  None. FINDINGS: Cardiomediastinal silhouette is normal. Mildly calcified aortic knob. No pleural effusions or focal consolidations. Trachea projects midline and there is no  pneumothorax. Soft tissue planes and included osseous structures are non-suspicious. IMPRESSION: No acute cardiopulmonary process. Electronically Signed   By: Elon Alas M.D.   On: 05/31/2016 00:05   Ct Renal Stone Study  Result Date: 05/31/2016 CLINICAL DATA:  Hematuria. Altered mental status after sneezing tonight. Hyperglycemia. History of hypertension, diabetes. EXAM: CT CHEST, ABDOMEN AND PELVIS WITHOUT CONTRAST TECHNIQUE: Multidetector CT imaging of the chest, abdomen and pelvis was performed following the standard protocol without IV contrast. COMPARISON:  Chest radiograph May 30, 2016 and  acute abdomen series May 12, 2016 and CT pelvis April 08, 2011 FINDINGS: CT CHEST FINDINGS CARDIOVASCULAR: Heart size is normal. Mild coronary artery calcifications. No pericardial effusions. Thoracic aorta is normal course and caliber, mild calcific atherosclerosis of the aortic arch. MEDIASTINUM/NODES: No mediastinal mass. Scattered subcentimeter mediastinal lymph nodes without lymphadenopathy by CT size criteria. Normal appearance of thoracic esophagus though not tailored for evaluation. LUNGS/PLEURA: Tracheobronchial tree is patent, no pneumothorax. Mild bronchial wall thickening. No pleural effusions, focal consolidations, pulmonary nodules or masses. RIGHT upper lobe atelectasis MUSCULOSKELETAL: Scattered sclerotic lesions throughout the thoracic spine, most conspicuous within spinous process T2, manubrium, and ventral T9 and T10 vertebral bodies without pathologic fracture. Sclerotic lesion RIGHT T10 rib. Soft tissues are normal. CT ABDOMEN PELVIS FINDINGS HEPATOBILIARY: Punctate calcified granuloma, liver otherwise unremarkable. Normal gallbladder. PANCREAS: Normal. SPLEEN: Normal. ADRENALS/URINARY TRACT: Kidneys are orthotopic, demonstrating normal size and morphology. No nephrolithiasis, hydronephrosis; limited assessment for renal masses on this nonenhanced examination. 4.8 cm RIGHT interpolar cyst Tom RIGHT lower pole exophytic 16 mm cyst. Pedunculated 6.2 cm cyst exophytic from lower pole LEFT kidney. The unopacified ureters are normal in course and caliber. Urinary bladder is decompressed containing Foley catheter in retaining bulb. Normal adrenal glands. STOMACH/BOWEL: The stomach, small and large bowel are normal in course and caliber without inflammatory changes, sensitivity decreased by lack of enteric contrast. VASCULAR/LYMPHATIC: Aortoiliac vessels are normal in course and caliber, mild calcific atherosclerosis. No lymphadenopathy by CT size criteria. REPRODUCTIVE: Prostatomegaly, 5.1 x  4.7 cm invades the base the urinary bladder. OTHER: No intraperitoneal free fluid or free air. MUSCULOSKELETAL: Patchy sclerosis bilateral iliac bones, RIGHT intertrochanteric femur, L1 ivory vertebral body with additional patchy sclerosis in the lumbosacral spine. No pathologic fracture. Severe L4-5 and L5-S1 degenerative discs. Moderate fat containing inguinal hernias. IMPRESSION: CT CHEST: Mild bronchial wall thickening associated with bronchitis or reactive airway disease without pneumonia. Sclerotic osseous metastasis without pathologic fracture, most commonly seen with prostate cancer. CT ABDOMEN AND PELVIS:  No acute abdominopelvic process. Prostatomegaly invading urinary bladder. Sclerotic osseous metastasis without pathologic fracture, most commonly seen with prostate cancer. Electronically Signed   By: Elon Alas M.D.   On: 05/31/2016 02:18     Procedures   Critical Care performed: CRITICAL CARE Performed by: Gregor Hams   Total critical care time: 45 minutes  Critical care time was exclusive of separately billable procedures and treating other patients.  Critical care was necessary to treat or prevent imminent or life-threatening deterioration.  Critical care was time spent personally by me on the following activities: development of treatment plan with patient and/or surrogate as well as nursing, discussions with consultants, evaluation of patient's response to treatment, examination of patient, obtaining history from patient or surrogate, ordering and performing treatments and interventions, ordering and review of laboratory studies, ordering and review of radiographic studies, pulse oximetry and re-evaluation of patient's condition.  ____________________________________________   INITIAL IMPRESSION / ASSESSMENT AND PLAN / ED COURSE  Pertinent labs &  imaging results that were available during my care of the patient were reviewed by me and considered in my medical  decision making (see chart for details).  History of physical exam concerning for possible sepsis given tachycardia and fever tachypnea as such sepsis protocol initiated. Patient discussed with [facility] [place] [facility] on call for hospital admission for further evaluation and management.   Clinical Course     ____________________________________________  FINAL CLINICAL IMPRESSION(S) / ED DIAGNOSES  Final diagnoses:  Sepsis, due to unspecified organism ([place])     MEDICATIONS GIVEN DURING THIS VISIT:  Medications  sodium chloride flush (NS) 0.9 % injection 3 mL (not administered)  0.9 %  sodium chloride infusion (not administered)  acetaminophen (TYLENOL) tablet 650 mg (not administered)    Or  acetaminophen (TYLENOL) suppository 650 mg (not administered)  senna-docusate (Senokot-S) tablet 1 tablet (not administered)  ondansetron (ZOFRAN) tablet 4 mg (not administered)    Or  ondansetron (ZOFRAN) injection 4 mg (not administered)  heparin injection 5,000 Units (not administered)  LORazepam (ATIVAN) 2 MG/ML injection (1 mg  Given 05/30/16 2331)  piperacillin-tazobactam (ZOSYN) IVPB 3.375 g (0 g Intravenous Stopped 05/31/16 0006)  vancomycin (VANCOCIN) IVPB 1000 mg/200 mL premix (0 mg Intravenous Stopped 05/31/16 0146)  sodium chloride 0.9 % bolus 1,000 mL (0 mLs Intravenous Stopped 05/31/16 0333)    And  sodium chloride 0.9 % bolus 1,000 mL (0 mLs Intravenous Stopped 05/31/16 0334)    And  sodium chloride 0.9 % bolus 500 mL (0 mLs Intravenous Stopped 05/31/16 0437)  acetaminophen (TYLENOL) suppository 650 mg (650 mg Rectal Given 05/30/16 2337)  acetaminophen (TYLENOL) 650 MG suppository (650 mg  Given 05/30/16 2332)     NEW OUTPATIENT MEDICATIONS STARTED DURING THIS VISIT:  New Prescriptions   No medications on file    Modified Medications   No medications on file    Discontinued Medications   No medications on file     Note:  This document was prepared using Dragon voice  recognition software and may include unintentional dictation errors.    Gregor Hams, MD 05/31/16 [phone]

## 2016-05-30 NOTE — ED Notes (Signed)
ED Provider at bedside. 

## 2016-05-30 NOTE — ED Triage Notes (Signed)
Pt comes into the ED via EMS from home c.o altered mental status that started around 22:00 after episodes of sneezing.  Patient CBG 329, 88% room air and then mid 90's when placed on 4 L [place].  Patient in NAD at this time with even and unlabored respirations.

## 2016-05-31 ENCOUNTER — Emergency Department: Payer: Medicare Other

## 2016-05-31 ENCOUNTER — Encounter: Payer: Self-pay | Admitting: Internal Medicine

## 2016-05-31 DIAGNOSIS — K5900 Constipation, unspecified: Secondary | ICD-10-CM | POA: Diagnosis present

## 2016-05-31 DIAGNOSIS — N189 Chronic kidney disease, unspecified: Secondary | ICD-10-CM | POA: Diagnosis present

## 2016-05-31 DIAGNOSIS — E1122 Type 2 diabetes mellitus with diabetic chronic kidney disease: Secondary | ICD-10-CM | POA: Diagnosis present

## 2016-05-31 DIAGNOSIS — E860 Dehydration: Secondary | ICD-10-CM | POA: Diagnosis present

## 2016-05-31 DIAGNOSIS — A419 Sepsis, unspecified organism: Secondary | ICD-10-CM | POA: Diagnosis present

## 2016-05-31 DIAGNOSIS — Z8042 Family history of malignant neoplasm of prostate: Secondary | ICD-10-CM | POA: Diagnosis not present

## 2016-05-31 DIAGNOSIS — E785 Hyperlipidemia, unspecified: Secondary | ICD-10-CM | POA: Diagnosis present

## 2016-05-31 DIAGNOSIS — Z79891 Long term (current) use of opiate analgesic: Secondary | ICD-10-CM | POA: Diagnosis not present

## 2016-05-31 DIAGNOSIS — I129 Hypertensive chronic kidney disease with stage 1 through stage 4 chronic kidney disease, or unspecified chronic kidney disease: Secondary | ICD-10-CM | POA: Diagnosis present

## 2016-05-31 DIAGNOSIS — Z79899 Other long term (current) drug therapy: Secondary | ICD-10-CM | POA: Diagnosis not present

## 2016-05-31 DIAGNOSIS — N419 Inflammatory disease of prostate, unspecified: Secondary | ICD-10-CM | POA: Diagnosis present

## 2016-05-31 DIAGNOSIS — Z794 Long term (current) use of insulin: Secondary | ICD-10-CM | POA: Diagnosis not present

## 2016-05-31 DIAGNOSIS — N179 Acute kidney failure, unspecified: Secondary | ICD-10-CM | POA: Diagnosis present

## 2016-05-31 DIAGNOSIS — M549 Dorsalgia, unspecified: Secondary | ICD-10-CM | POA: Diagnosis not present

## 2016-05-31 DIAGNOSIS — Z515 Encounter for palliative care: Secondary | ICD-10-CM | POA: Diagnosis not present

## 2016-05-31 DIAGNOSIS — L89152 Pressure ulcer of sacral region, stage 2: Secondary | ICD-10-CM | POA: Diagnosis not present

## 2016-05-31 DIAGNOSIS — R2681 Unsteadiness on feet: Secondary | ICD-10-CM | POA: Diagnosis not present

## 2016-05-31 DIAGNOSIS — R6520 Severe sepsis without septic shock: Secondary | ICD-10-CM | POA: Diagnosis present

## 2016-05-31 DIAGNOSIS — N400 Enlarged prostate without lower urinary tract symptoms: Secondary | ICD-10-CM | POA: Diagnosis present

## 2016-05-31 DIAGNOSIS — Z7189 Other specified counseling: Secondary | ICD-10-CM | POA: Diagnosis not present

## 2016-05-31 DIAGNOSIS — Z66 Do not resuscitate: Secondary | ICD-10-CM | POA: Diagnosis not present

## 2016-05-31 DIAGNOSIS — Z7982 Long term (current) use of aspirin: Secondary | ICD-10-CM | POA: Diagnosis not present

## 2016-05-31 DIAGNOSIS — R4182 Altered mental status, unspecified: Secondary | ICD-10-CM | POA: Diagnosis present

## 2016-05-31 DIAGNOSIS — C61 Malignant neoplasm of prostate: Secondary | ICD-10-CM | POA: Diagnosis not present

## 2016-05-31 DIAGNOSIS — B349 Viral infection, unspecified: Secondary | ICD-10-CM | POA: Diagnosis present

## 2016-05-31 DIAGNOSIS — C7951 Secondary malignant neoplasm of bone: Secondary | ICD-10-CM | POA: Diagnosis present

## 2016-05-31 DIAGNOSIS — R509 Fever, unspecified: Secondary | ICD-10-CM | POA: Diagnosis not present

## 2016-05-31 DIAGNOSIS — R310 Gross hematuria: Secondary | ICD-10-CM | POA: Diagnosis not present

## 2016-05-31 DIAGNOSIS — G9341 Metabolic encephalopathy: Secondary | ICD-10-CM | POA: Diagnosis present

## 2016-05-31 LAB — BASIC METABOLIC PANEL
ANION GAP: 6 (ref 5–15)
Anion gap: 5 (ref 5–15)
BUN: 40 mg/dL — ABNORMAL HIGH (ref 6–20)
BUN: 37 mg/dL — AB (ref 6–20)
CALCIUM: 7.4 mg/dL — AB (ref 8.9–10.3)
CO2: 27 mmol/L (ref 22–32)
CO2: 22 mmol/L (ref 22–32)
CREATININE: 2.12 mg/dL — AB (ref 0.61–1.24)
Calcium: 8.2 mg/dL — ABNORMAL LOW (ref 8.9–10.3)
Chloride: 107 mmol/L (ref 101–111)
Chloride: 110 mmol/L (ref 101–111)
Creatinine, Ser: 2.3 mg/dL — ABNORMAL HIGH (ref 0.61–1.24)
GFR calc Af Amer: 30 mL/min — ABNORMAL LOW (ref >60)
GFR calc non Af Amer: 23 mL/min — ABNORMAL LOW (ref >60)
GFR, EST AFRICAN AMERICAN: 27 mL/min — AB (ref >60)
GFR, EST NON AFRICAN AMERICAN: 26 mL/min — AB (ref >60)
Glucose, Bld: 254 mg/dL — ABNORMAL HIGH (ref 65–99)
Glucose, Bld: 267 mg/dL — ABNORMAL HIGH (ref 65–99)
POTASSIUM: 4.8 mmol/L (ref 3.5–5.1)
POTASSIUM: 4.7 mmol/L (ref 3.5–5.1)
SODIUM: 140 mmol/L (ref 135–145)
SODIUM: 137 mmol/L (ref 135–145)

## 2016-05-31 LAB — CBC
HEMATOCRIT: 29.7 % — AB (ref 40.0–52.0)
Hemoglobin: 9.8 g/dL — ABNORMAL LOW (ref 13.0–18.0)
MCH: 31.2 pg (ref 26.0–34.0)
MCHC: 33.1 g/dL (ref 32.0–36.0)
MCV: 94.2 fL (ref 80.0–100.0)
PLATELETS: 126 10*3/uL — AB (ref 150–440)
RBC: 3.15 MIL/uL — ABNORMAL LOW (ref 4.40–5.90)
RDW: 13.2 % (ref 11.5–14.5)
WBC: 8.7 10*3/uL (ref 3.8–10.6)

## 2016-05-31 LAB — URINALYSIS, COMPLETE (UACMP) WITH MICROSCOPIC
Bacteria, UA: NONE SEEN
Bilirubin Urine: NEGATIVE
HGB URINE DIPSTICK: NEGATIVE
Ketones, ur: NEGATIVE mg/dL
Leukocytes, UA: NEGATIVE
NITRITE: NEGATIVE
Protein, ur: 30 mg/dL — AB
SPECIFIC GRAVITY, URINE: 1.012 (ref 1.005–1.030)
Squamous Epithelial / LPF: NONE SEEN
pH: 5 (ref 5.0–8.0)

## 2016-05-31 LAB — GLUCOSE, CAPILLARY
GLUCOSE-CAPILLARY: 310 mg/dL — AB (ref 65–99)
Glucose-Capillary: 248 mg/dL — ABNORMAL HIGH (ref 65–99)

## 2016-05-31 LAB — BLOOD GAS, VENOUS
ACID-BASE EXCESS: 0.4 mmol/L (ref 0.0–2.0)
Bicarbonate: 27.4 mmol/L (ref 20.0–28.0)
PATIENT TEMPERATURE: 37
PCO2 VEN: 52 mmHg (ref 44.0–60.0)
pH, Ven: 7.33 (ref 7.250–7.430)

## 2016-05-31 LAB — RAPID INFLUENZA A&B ANTIGENS (ARMC ONLY)
INFLUENZA A (ARMC): NEGATIVE
INFLUENZA B (ARMC): NEGATIVE

## 2016-05-31 LAB — LACTIC ACID, PLASMA
LACTIC ACID, VENOUS: 1.2 mmol/L (ref 0.5–1.9)
Lactic Acid, Venous: 1.7 mmol/L (ref 0.5–1.9)
Lactic Acid, Venous: 1.3 mmol/L (ref 0.5–1.9)

## 2016-05-31 LAB — TROPONIN I: TROPONIN I: 0.03 ng/mL — AB (ref <0.03)

## 2016-05-31 LAB — PSA: PSA: 91.36 ng/mL — AB (ref 0.00–4.00)

## 2016-05-31 MED ORDER — VANCOMYCIN HCL IN DEXTROSE 1-5 GM/200ML-% IV SOLN
1000.0000 mg | INTRAVENOUS | Status: DC
  Administered 2016-05-31: 1000 mg via INTRAVENOUS
  Filled 2016-05-31 (×2): qty 200

## 2016-05-31 MED ORDER — PIPERACILLIN-TAZOBACTAM 3.375 G IVPB 30 MIN: 3.3750 g | Freq: Once | INTRAVENOUS | Status: DC

## 2016-05-31 MED ORDER — ONDANSETRON HCL 4 MG/2ML IJ SOLN: 4.0000 mg | Freq: Four times a day (QID) | INTRAMUSCULAR | Status: DC | PRN

## 2016-05-31 MED ORDER — ONDANSETRON HCL 4 MG PO TABS: 4.0000 mg | ORAL_TABLET | Freq: Four times a day (QID) | ORAL | Status: DC | PRN

## 2016-05-31 MED ORDER — INSULIN ASPART 100 UNIT/ML ~~LOC~~ SOLN
0.0000 [IU] | Freq: Three times a day (TID) | SUBCUTANEOUS | Status: DC
  Administered 2016-05-31: 7 [IU] via SUBCUTANEOUS
  Administered 2016-06-01 (×2): 3 [IU] via SUBCUTANEOUS
  Administered 2016-06-01 – 2016-06-02 (×2): 2 [IU] via SUBCUTANEOUS
  Administered 2016-06-02: 1 [IU] via SUBCUTANEOUS
  Administered 2016-06-02 – 2016-06-03 (×2): 2 [IU] via SUBCUTANEOUS
  Administered 2016-06-03: 3 [IU] via SUBCUTANEOUS
  Administered 2016-06-04: 1 [IU] via SUBCUTANEOUS
  Filled 2016-05-31: qty 7
  Filled 2016-05-31: qty 1
  Filled 2016-05-31 (×2): qty 2
  Filled 2016-05-31 (×4): qty 3
  Filled 2016-05-31: qty 1

## 2016-05-31 MED ORDER — SODIUM CHLORIDE 0.9% FLUSH
3.0000 mL | Freq: Two times a day (BID) | INTRAVENOUS | Status: DC
  Administered 2016-05-31 – 2016-06-06 (×10): 3 mL via INTRAVENOUS

## 2016-05-31 MED ORDER — SENNOSIDES-DOCUSATE SODIUM 8.6-50 MG PO TABS
1.0000 | ORAL_TABLET | Freq: Every evening | ORAL | Status: DC | PRN
  Administered 2016-06-03: 1 via ORAL
  Filled 2016-05-31: qty 1

## 2016-05-31 MED ORDER — TERAZOSIN HCL 5 MG PO CAPS
5.0000 mg | ORAL_CAPSULE | Freq: Every day | ORAL | Status: DC
  Administered 2016-05-31 – 2016-06-02 (×3): 5 mg via ORAL
  Filled 2016-05-31 (×4): qty 1

## 2016-05-31 MED ORDER — VANCOMYCIN HCL IN DEXTROSE 1-5 GM/200ML-% IV SOLN: 1000.0000 mg | Freq: Once | INTRAVENOUS | Status: DC

## 2016-05-31 MED ORDER — PIPERACILLIN-TAZOBACTAM 3.375 G IVPB
3.3750 g | Freq: Three times a day (TID) | INTRAVENOUS | Status: DC
  Administered 2016-05-31 – 2016-06-01 (×6): 3.375 g via INTRAVENOUS
  Filled 2016-05-31 (×6): qty 50

## 2016-05-31 MED ORDER — ACETAMINOPHEN 325 MG PO TABS
650.0000 mg | ORAL_TABLET | Freq: Four times a day (QID) | ORAL | Status: DC | PRN
  Administered 2016-06-01 – 2016-06-03 (×2): 650 mg via ORAL
  Filled 2016-05-31 (×2): qty 2

## 2016-05-31 MED ORDER — ACETAMINOPHEN 650 MG RE SUPP: 650.0000 mg | Freq: Four times a day (QID) | RECTAL | Status: DC | PRN

## 2016-05-31 MED ORDER — SODIUM CHLORIDE 0.9 % IV BOLUS (SEPSIS): 1000.0000 mL | Freq: Once | INTRAVENOUS | Status: DC

## 2016-05-31 MED ORDER — HEPARIN SODIUM (PORCINE) 5000 UNIT/ML IJ SOLN
5000.0000 [IU] | Freq: Three times a day (TID) | INTRAMUSCULAR | Status: DC
  Administered 2016-05-31 – 2016-06-01 (×4): 5000 [IU] via SUBCUTANEOUS
  Filled 2016-05-31 (×4): qty 1

## 2016-05-31 MED ORDER — SODIUM CHLORIDE 0.9 % IV BOLUS (SEPSIS): 500.0000 mL | Freq: Once | INTRAVENOUS | Status: DC

## 2016-05-31 MED ORDER — ASPIRIN EC 325 MG PO TBEC
325.0000 mg | DELAYED_RELEASE_TABLET | Freq: Every day | ORAL | Status: DC
  Filled 2016-05-31: qty 1

## 2016-05-31 MED ORDER — INSULIN DETEMIR 100 UNIT/ML ~~LOC~~ SOLN
8.0000 [IU] | Freq: Every day | SUBCUTANEOUS | Status: DC
  Administered 2016-05-31 – 2016-06-03 (×4): 8 [IU] via SUBCUTANEOUS
  Filled 2016-05-31 (×6): qty 0.08

## 2016-05-31 MED ORDER — ATORVASTATIN CALCIUM 20 MG PO TABS
40.0000 mg | ORAL_TABLET | Freq: Every day | ORAL | Status: DC
  Administered 2016-05-31 – 2016-06-03 (×4): 40 mg via ORAL
  Filled 2016-05-31 (×4): qty 2

## 2016-05-31 MED ORDER — SODIUM CHLORIDE 0.9 % IV SOLN
INTRAVENOUS | Status: DC
  Administered 2016-05-31 – 2016-06-03 (×4): via INTRAVENOUS

## 2016-05-31 MED ORDER — INSULIN ASPART 100 UNIT/ML ~~LOC~~ SOLN
0.0000 [IU] | Freq: Every day | SUBCUTANEOUS | Status: DC
  Administered 2016-05-31: 2 [IU] via SUBCUTANEOUS
  Administered 2016-06-02: 3 [IU] via SUBCUTANEOUS
  Filled 2016-05-31: qty 2
  Filled 2016-05-31: qty 3

## 2016-05-31 MED ORDER — ENOXAPARIN SODIUM 30 MG/0.3ML ~~LOC~~ SOLN: 30.0000 mg | Freq: Every day | SUBCUTANEOUS | Status: DC

## 2016-05-31 NOTE — ED Notes (Signed)
Patient transported to CT 

## 2016-05-31 NOTE — ED Notes (Signed)
Wife's phone number [phone]

## 2016-05-31 NOTE — ED Notes (Signed)
Pt resting quietly with eyes closed.  nsr on monitor.  siderails up x 2.  Foley cath in place

## 2016-05-31 NOTE — Progress Notes (Signed)
Pharmacist - Prescriber Communication  Enoxaparin dose has been changed to 30 mg subcutaneously once daily for CrCl less than 30 mL/min.  Guy Hudson [address], [place].D., BCPS Clinical Pharmacist 05/31/16 [phone]

## 2016-05-31 NOTE — ED Notes (Signed)
Lab called with troponin of 0.03  md aware.

## 2016-05-31 NOTE — Progress Notes (Signed)
Arrival Method: via stretcher with ED NT Mental Orientation: A&O x 1, difficult to assess at this time Telemetry: [employer] 4, verified by [employer] Skin:?Intact, Verified by Levester Fresh, RN IV:?2x 20g right arm Pain: patient did not verbalize pain and appears comfortable  Tubes:?foley catheter Safety Measures: Py confused on admission, reiterated safety measures and bed alarm activated  Family: Spoke with wife via telephone. Will continue to monitor the patient. Call light has been placed within reach.

## 2016-05-31 NOTE — H&P (Signed)
[facility] NAME: Guy Hudson    MR#:  [id number]  DATE OF BIRTH:  1923-12-05  DATE OF ADMISSION:  05/30/2016  PRIMARY CARE PHYSICIAN: Albina Billet, MD   REQUESTING/REFERRING PHYSICIAN:   CHIEF COMPLAINT:   Chief Complaint  Patient presents with  . Altered Mental Status    HISTORY OF PRESENT ILLNESS: Guy Hudson  is a 83 y.o. male with a known history of Hypertension, diabetes mellitus type 2, hyperlipidemia was brought from home for confusion and fever. Patient had a fever of 10 54F or also had occasional cough. No family members were available to get any history. Patient is arousable to loud verbal commands and painful stimuli. He is not completely oriented to time place and person. He was febrile when he presented to the emergency room and his oxygen saturation was around 88% on room air. Blood sugar on arrival was 3 29 mg/dL. Patient was put on oxygen via nasal cannula and IV fluids were given sepsis protocol. Patient received broad-spectrum IV antibiotics in the emergency room. Not much history could be obtained from the patient. Patient was worked up with CT chest and CT abdomen in the emergency room. CT chest showed bronchitis and CT abdomen no acute pathology.  PAST MEDICAL HISTORY:   Past Medical History:  Diagnosis Date  . Diabetes mellitus without complication ([place])   . Hypertension     PAST SURGICAL HISTORY: Past Surgical History:  Procedure Laterality Date  . unknown      SOCIAL HISTORY:  Social History  Substance Use Topics  . Smoking status: Never Smoker  . Smokeless tobacco: Never Used  . Alcohol use No    FAMILY HISTORY:  Family History  Problem Relation Age of Onset  . Family history unknown: Yes    DRUG ALLERGIES: No Known Allergies  REVIEW OF SYSTEMS:  Could not be obtained as patient is confused and altered.  MEDICATIONS AT HOME:  Prior to Admission medications   Medication Sig  Start Date End Date Taking? Authorizing Provider  acetaminophen (TYLENOL) 500 MG tablet Take 500-1,000 mg by mouth every 6 (six) hours as needed for mild pain or headache.    Historical Provider, MD  aspirin EC 325 MG tablet Take 325 mg by mouth daily.    Historical Provider, MD  ferrous sulfate 325 (65 FE) MG tablet Take 325 mg by mouth 3 (three) times a week. Pt takes on Monday, Wednesday, and Friday.    Historical Provider, MD  furosemide (LASIX) 40 MG tablet Take 40 mg by mouth daily. Pt does not take on Monday, Wednesday, and Friday.    Historical Provider, MD  insulin aspart (NOVOLOG) 100 UNIT/ML injection Inject 8 Units into the skin 2 (two) times daily.    Historical Provider, MD  insulin detemir (LEVEMIR) 100 UNIT/ML injection Inject 35 Units into the skin 2 (two) times daily.    Historical Provider, MD  metoprolol (LOPRESSOR) 50 MG tablet Take 50 mg by mouth 2 (two) times daily.    Historical Provider, MD  oxyCODONE-acetaminophen (ROXICET) 5-325 MG per tablet Take 1 tablet by mouth every 6 (six) hours as needed. 12/19/14   Earleen Newport, MD  polyethylene glycol [facility]) packet Take 17 g by mouth daily. 05/12/16   Daymon Larsen, MD  simvastatin (ZOCOR) 80 MG tablet Take 40 mg by mouth at bedtime.    Historical Provider, MD  terazosin (HYTRIN) 5 MG capsule Take 5 mg by mouth  at bedtime.    Historical Provider, MD      PHYSICAL EXAMINATION:   VITAL SIGNS: Blood pressure (!) 137/117, pulse 100, temperature (!) 102.1 F (38.9 C), temperature source Rectal, resp. rate (!) 21, height 5\' 10"  (1.778 m), weight 81.6 kg (180 lb), SpO2 100 %.  GENERAL:  83 y.o.-year-old patient lying in the bed some times calling out loudly. EYES: Pupils equal, round, reactive to light and accommodation. No scleral icterus. Extraocular muscles intact.  HEENT: Head atraumatic, normocephalic. Oropharynx dry and nasopharynx clear.  NECK:  Supple, no stiffness, no jugular venous distention. No thyroid  enlargement, no tenderness.  LUNGS: Normal breath sounds bilaterally, no wheezing, scattered rales right lung. No use of accessory muscles of respiration.  CARDIOVASCULAR: S1, S2 tachycardia noted. No murmurs, rubs, or gallops.  ABDOMEN: Soft, nontender, nondistended. Bowel sounds present. No organomegaly or mass.  EXTREMITIES: No pedal edema, cyanosis, or clubbing.  NEUROLOGIC: Not oriented to time, place and person. Moves all extremities. PSYCHIATRIC: could not be assessed SKIN: No obvious rash, lesion, or ulcer.   LABORATORY PANEL:   CBC  Recent Labs Lab 05/30/16 2324  WBC 8.3  HGB 11.3*  HCT 33.0*  PLT 142*  MCV 93.3  MCH 31.9  MCHC 34.2  RDW 13.5   ------------------------------------------------------------------------------------------------------------------  Chemistries   Recent Labs Lab 05/30/16 2324  NA 140  K 4.8  CL 107  CO2 27  GLUCOSE 254*  BUN 40*  CREATININE 2.30*  CALCIUM 8.2*   ------------------------------------------------------------------------------------------------------------------ estimated creatinine clearance is 22 mL/min (by C-G formula based on SCr of 2.3 mg/dL (H)). ------------------------------------------------------------------------------------------------------------------ No results for input(s): TSH, T4TOTAL, T3FREE, THYROIDAB in the last 72 hours.  Invalid input(s): FREET3   Coagulation profile No results for input(s): INR, PROTIME in the last 168 hours. ------------------------------------------------------------------------------------------------------------------- No results for input(s): DDIMER in the last 72 hours. -------------------------------------------------------------------------------------------------------------------  Cardiac Enzymes  Recent Labs Lab 05/30/16 2324  TROPONINI 0.03*    ------------------------------------------------------------------------------------------------------------------ Invalid input(s): POCBNP  ---------------------------------------------------------------------------------------------------------------  Urinalysis    Component Value Date/Time   COLORURINE YELLOW (A) 05/30/2016 2325   APPEARANCEUR CLEAR (A) 05/30/2016 2325   LABSPEC 1.012 05/30/2016 2325   PHURINE 5.0 05/30/2016 2325   GLUCOSEU >=500 (A) 05/30/2016 2325   HGBUR NEGATIVE 05/30/2016 2325   BILIRUBINUR NEGATIVE 05/30/2016 2325   KETONESUR NEGATIVE 05/30/2016 2325   PROTEINUR 30 (A) 05/30/2016 2325   NITRITE NEGATIVE 05/30/2016 2325   LEUKOCYTESUR NEGATIVE 05/30/2016 2325     RADIOLOGY: Ct Head Wo Contrast  Result Date: 05/31/2016 CLINICAL DATA:  Altered mental status beginning at 2200 hours tonight after sneezing. Hyperglycemia. History of hypertension and diabetes. EXAM: CT HEAD WITHOUT CONTRAST TECHNIQUE: Contiguous axial images were obtained from the base of the skull through the vertex without intravenous contrast. COMPARISON:  CT HEAD December 19, 2014 FINDINGS: BRAIN: The ventricles and sulci are normal for age. No intraparenchymal hemorrhage, mass effect nor midline shift. Patchy supratentorial white matter hypodensities less than expected for patient's age, though non-specific are most compatible with chronic small vessel ischemic disease. No acute large vascular territory infarcts. No abnormal extra-axial fluid collections. Basal cisterns are patent. VASCULAR: Mild to moderate calcific atherosclerosis of the carotid siphons. SKULL: No skull fracture. No significant scalp soft tissue swelling. SINUSES/ORBITS: The mastoid air-cells and included paranasal sinuses are well-aerated. Status post bilateral ocular lens implants. The included ocular globes and orbital contents are non-suspicious. OTHER: Patient is edentulous. IMPRESSION: Negative CT HEAD for age. Electronically  Signed   By: Thana Farr.D.  On: 05/31/2016 00:08   Ct Chest Wo Contrast  Result Date: 05/31/2016 CLINICAL DATA:  Hematuria. Altered mental status after sneezing tonight. Hyperglycemia. History of hypertension, diabetes. EXAM: CT CHEST, ABDOMEN AND PELVIS WITHOUT CONTRAST TECHNIQUE: Multidetector CT imaging of the chest, abdomen and pelvis was performed following the standard protocol without IV contrast. COMPARISON:  Chest radiograph May 30, 2016 and acute abdomen series May 12, 2016 and CT pelvis April 08, 2011 FINDINGS: CT CHEST FINDINGS CARDIOVASCULAR: Heart size is normal. Mild coronary artery calcifications. No pericardial effusions. Thoracic aorta is normal course and caliber, mild calcific atherosclerosis of the aortic arch. MEDIASTINUM/NODES: No mediastinal mass. Scattered subcentimeter mediastinal lymph nodes without lymphadenopathy by CT size criteria. Normal appearance of thoracic esophagus though not tailored for evaluation. LUNGS/PLEURA: Tracheobronchial tree is patent, no pneumothorax. Mild bronchial wall thickening. No pleural effusions, focal consolidations, pulmonary nodules or masses. RIGHT upper lobe atelectasis MUSCULOSKELETAL: Scattered sclerotic lesions throughout the thoracic spine, most conspicuous within spinous process T2, manubrium, and ventral T9 and T10 vertebral bodies without pathologic fracture. Sclerotic lesion RIGHT T10 rib. Soft tissues are normal. CT ABDOMEN PELVIS FINDINGS HEPATOBILIARY: Punctate calcified granuloma, liver otherwise unremarkable. Normal gallbladder. PANCREAS: Normal. SPLEEN: Normal. ADRENALS/URINARY TRACT: Kidneys are orthotopic, demonstrating normal size and morphology. No nephrolithiasis, hydronephrosis; limited assessment for renal masses on this nonenhanced examination. 4.8 cm RIGHT interpolar cyst Tom RIGHT lower pole exophytic 16 mm cyst. Pedunculated 6.2 cm cyst exophytic from lower pole LEFT kidney. The unopacified ureters are  normal in course and caliber. Urinary bladder is decompressed containing Foley catheter in retaining bulb. Normal adrenal glands. STOMACH/BOWEL: The stomach, small and large bowel are normal in course and caliber without inflammatory changes, sensitivity decreased by lack of enteric contrast. VASCULAR/LYMPHATIC: Aortoiliac vessels are normal in course and caliber, mild calcific atherosclerosis. No lymphadenopathy by CT size criteria. REPRODUCTIVE: Prostatomegaly, 5.1 x 4.7 cm invades the base the urinary bladder. OTHER: No intraperitoneal free fluid or free air. MUSCULOSKELETAL: Patchy sclerosis bilateral iliac bones, RIGHT intertrochanteric femur, L1 ivory vertebral body with additional patchy sclerosis in the lumbosacral spine. No pathologic fracture. Severe L4-5 and L5-S1 degenerative discs. Moderate fat containing inguinal hernias. IMPRESSION: CT CHEST: Mild bronchial wall thickening associated with bronchitis or reactive airway disease without pneumonia. Sclerotic osseous metastasis without pathologic fracture, most commonly seen with prostate cancer. CT ABDOMEN AND PELVIS:  No acute abdominopelvic process. Prostatomegaly invading urinary bladder. Sclerotic osseous metastasis without pathologic fracture, most commonly seen with prostate cancer. Electronically Signed   By: Elon Alas M.D.   On: 05/31/2016 02:18   Dg Chest Port 1 View  Result Date: 05/31/2016 CLINICAL DATA:  Altered mental status beginning at 2200 hours after sneezing. Hyperglycemia. EXAM: PORTABLE CHEST 1 VIEW COMPARISON:  None. FINDINGS: Cardiomediastinal silhouette is normal. Mildly calcified aortic knob. No pleural effusions or focal consolidations. Trachea projects midline and there is no pneumothorax. Soft tissue planes and included osseous structures are non-suspicious. IMPRESSION: No acute cardiopulmonary process. Electronically Signed   By: Elon Alas M.D.   On: 05/31/2016 00:05   Ct Renal Stone Study  Result Date:  05/31/2016 CLINICAL DATA:  Hematuria. Altered mental status after sneezing tonight. Hyperglycemia. History of hypertension, diabetes. EXAM: CT CHEST, ABDOMEN AND PELVIS WITHOUT CONTRAST TECHNIQUE: Multidetector CT imaging of the chest, abdomen and pelvis was performed following the standard protocol without IV contrast. COMPARISON:  Chest radiograph May 30, 2016 and acute abdomen series May 12, 2016 and CT pelvis April 08, 2011 FINDINGS: CT CHEST FINDINGS CARDIOVASCULAR: Heart size is  normal. Mild coronary artery calcifications. No pericardial effusions. Thoracic aorta is normal course and caliber, mild calcific atherosclerosis of the aortic arch. MEDIASTINUM/NODES: No mediastinal mass. Scattered subcentimeter mediastinal lymph nodes without lymphadenopathy by CT size criteria. Normal appearance of thoracic esophagus though not tailored for evaluation. LUNGS/PLEURA: Tracheobronchial tree is patent, no pneumothorax. Mild bronchial wall thickening. No pleural effusions, focal consolidations, pulmonary nodules or masses. RIGHT upper lobe atelectasis MUSCULOSKELETAL: Scattered sclerotic lesions throughout the thoracic spine, most conspicuous within spinous process T2, manubrium, and ventral T9 and T10 vertebral bodies without pathologic fracture. Sclerotic lesion RIGHT T10 rib. Soft tissues are normal. CT ABDOMEN PELVIS FINDINGS HEPATOBILIARY: Punctate calcified granuloma, liver otherwise unremarkable. Normal gallbladder. PANCREAS: Normal. SPLEEN: Normal. ADRENALS/URINARY TRACT: Kidneys are orthotopic, demonstrating normal size and morphology. No nephrolithiasis, hydronephrosis; limited assessment for renal masses on this nonenhanced examination. 4.8 cm RIGHT interpolar cyst Tom RIGHT lower pole exophytic 16 mm cyst. Pedunculated 6.2 cm cyst exophytic from lower pole LEFT kidney. The unopacified ureters are normal in course and caliber. Urinary bladder is decompressed containing Foley catheter in retaining  bulb. Normal adrenal glands. STOMACH/BOWEL: The stomach, small and large bowel are normal in course and caliber without inflammatory changes, sensitivity decreased by lack of enteric contrast. VASCULAR/LYMPHATIC: Aortoiliac vessels are normal in course and caliber, mild calcific atherosclerosis. No lymphadenopathy by CT size criteria. REPRODUCTIVE: Prostatomegaly, 5.1 x 4.7 cm invades the base the urinary bladder. OTHER: No intraperitoneal free fluid or free air. MUSCULOSKELETAL: Patchy sclerosis bilateral iliac bones, RIGHT intertrochanteric femur, L1 ivory vertebral body with additional patchy sclerosis in the lumbosacral spine. No pathologic fracture. Severe L4-5 and L5-S1 degenerative discs. Moderate fat containing inguinal hernias. IMPRESSION: CT CHEST: Mild bronchial wall thickening associated with bronchitis or reactive airway disease without pneumonia. Sclerotic osseous metastasis without pathologic fracture, most commonly seen with prostate cancer. CT ABDOMEN AND PELVIS:  No acute abdominopelvic process. Prostatomegaly invading urinary bladder. Sclerotic osseous metastasis without pathologic fracture, most commonly seen with prostate cancer. Electronically Signed   By: Elon Alas M.D.   On: 05/31/2016 02:18    EKG: Orders placed or performed during the hospital encounter of 05/30/16  . ED EKG  . ED EKG  . EKG 12-Lead  . EKG 12-Lead  . ED EKG 12-Lead  . ED EKG 12-Lead    IMPRESSION AND PLAN: 83 year old elderly male patient with history of hypertension, hyperlipidemia, type 2 diabetes mellitus presented to the emergency room with fever, tachycardia and confusion. Admitting diagnosis 1. Sepsis 2. Altered mental status 3. Dehydration 4. Acute renal failure 5. Hypertension 6. Diabetes mellitus type 2 Treatment plan Admit patient to telemetry inpatient service IV fluid hydration Start patient on IV vancomycin and IV Zosyn antibiotic Follow-up cultures Check flu swab Follow-up  renal function DVT prophylaxis subcutaneous heparin Supportive care.  All the records are reviewed and case discussed with ED provider. Management plans discussed with the patient, family and they are in agreement.  CODE STATUS:FULL CODE    Code Status Orders        Start     Ordered   05/31/16 0249  Full code  Continuous     05/31/16 0249    Code Status History    Date Active Date Inactive Code Status Order ID Comments User Context   This patient has a current code status but no historical code status.       TOTAL TIME TAKING CARE OF THIS PATIENT: 53 minutes.    Saundra Shelling M.D on 05/31/2016 at 2:50 AM  Between 7am to 6pm - Pager - [phone]  After 6pm go to [email or web address] - password [email or web address] [facility]  Office  [phone]  CC: Primary care physician; Albina Billet, MD

## 2016-05-31 NOTE — ED Notes (Signed)
Report off to rebecca rn 

## 2016-05-31 NOTE — Evaluation (Signed)
Clinical/Bedside Swallow Evaluation Patient Details  Name: Guy Hudson MRN: [id number] Date of Birth: March 29, 1924  Today's Date: 05/31/2016 Time: SLP Start Time (ACUTE ONLY): 1400 SLP Stop Time (ACUTE ONLY): 1500 SLP Time Calculation (min) (ACUTE ONLY): 60 min  Past Medical History:  Past Medical History:  Diagnosis Date  . Diabetes mellitus without complication ([place])   . Hypertension    Past Surgical History:  Past Surgical History:  Procedure Laterality Date  . unknown     HPI:  Pt is a 83 y.o. male with a known history of Hypertension, diabetes mellitus type 2, hyperlipidemia was brought from home for confusion and fever. Patient had a fever of 10 12F or also had occasional cough. No family members were available to get any history. Patient is arousable to loud verbal commands and painful stimuli. He is not completely oriented to time place and person. He was febrile when he presented to the emergency room and his oxygen saturation was around 88% on room air. Blood sugar on arrival was 3 29 mg/dL. Patient was put on oxygen via nasal cannula and IV fluids were given sepsis protocol. Patient received broad-spectrum IV antibiotics in the emergency room. Not much history could be obtained from the patient. Patient was worked up with CT chest and CT abdomen in the emergency room. CT chest showed bronchitis. Unsure of pt's GI baseline w/ regard to potential esophageal dysmotility and/or Reflux d/t pt's consistent belching during po trials/eval. Pt is not on a PPI currently per medication list. Also unsure of pt's baseline Cognitive status - he is moderately confused at this time and requires moderate verbal/tactile cues for follow through w/ tasks.    Assessment / Plan / Recommendation Clinical Impression  Pt appears at min increased risk for aspiration at this time d/t declined Cognitive status requiring moderate cueing to follow through w/ tasks as well as frequent s/s of reflux to include  constant belching w/ oral intake. When following general aspiration precautions and given assistance and monitoring/cues during feeding of po trials, pt appeared at reduced risk for aspiration w/ oral intake. Pt consumed trials of thin liquids via cup and puree then soft solids w/ no overt s/s of aspiration noted; oral phase c/b min increased mastication time/effort w/ increased textured trials(solids). Pt helped to hold cup to drink w/ mod assistance; frequent verbal cues. Pt was wearing his dentures. Recommend a Dysphagia level 2 diet w/ thin liquids at this time for easier mastication (denture use). Recommend general aspiration precautions, monitoring and assistance at all meals, and meds in Puree. NSG updated    Aspiration Risk  Mild aspiration risk (d/t declined Cognitive status at this time; belching/reflux)    Diet Recommendation  Dysphagia level 2 w/ thin liquids; aspiration precautions; reflux precautions; feeding support at all meals   Medication Administration: Crushed with puree (may give Whole if able to swallow appropriately)    Other  Recommendations Recommended Consults: Consider GI evaluation (Dietician f/u) Oral Care Recommendations: Oral care BID;Staff/trained caregiver to provide oral care   Follow up Recommendations  (TBD)      Frequency and Duration min 3x week  2 weeks       Prognosis Prognosis for Safe Diet Advancement: Fair (-Good) Barriers to Reach Goals: Cognitive deficits;Behavior (potential reflux issues)      Swallow Study   General Date of Onset: 05/30/16 HPI: Pt is a 83 y.o. male with a known history of Hypertension, diabetes mellitus type 2, hyperlipidemia was brought from home for  confusion and fever. Patient had a fever of 10 73F or also had occasional cough. No family members were available to get any history. Patient is arousable to loud verbal commands and painful stimuli. He is not completely oriented to time place and person. He was febrile when he  presented to the emergency room and his oxygen saturation was around 88% on room air. Blood sugar on arrival was 3 29 mg/dL. Patient was put on oxygen via nasal cannula and IV fluids were given sepsis protocol. Patient received broad-spectrum IV antibiotics in the emergency room. Not much history could be obtained from the patient. Patient was worked up with CT chest and CT abdomen in the emergency room. CT chest showed bronchitis. Unsure of pt's GI baseline w/ regard to potential esophageal dysmotility and/or Reflux d/t pt's consistent belching during po trials/eval. Pt is not on a PPI currently per medication list. Also unsure of pt's baseline Cognitive status - he is moderately confused at this time and requires moderate verbal/tactile cues for follow through w/ tasks.  Type of Study: Bedside Swallow Evaluation Previous Swallow Assessment: none indicated Diet Prior to this Study: Regular;Thin liquids (per wife) Temperature Spikes Noted: No (wbc 8.7.  Temp was elevated at admission; not currently.) Respiratory Status: Nasal cannula (2 liters) History of Recent Intubation: No Behavior/Cognition: Cooperative;Pleasant mood;Confused;Distractible;Requires cueing (awake) Oral Cavity Assessment: Dry (sticky) Oral Care Completed by SLP: Yes Oral Cavity - Dentition: Dentures, top;Dentures, bottom (in place) Vision: Functional for self-feeding Self-Feeding Abilities: Able to feed self;Needs assist;Needs set up;Total assist Patient Positioning: Upright in bed Baseline Vocal Quality: Normal (mumbled speech at times d/t confusion) Volitional Cough: Cognitively unable to elicit Volitional Swallow: Unable to elicit    Oral/Motor/Sensory Function Overall Oral Motor/Sensory Function: Within functional limits (grossly assessed d/t cognitive status; wfl w/ po's)   Ice Chips Ice chips: Within functional limits Presentation: Spoon (fed; 3 trials)   Thin Liquid Thin Liquid: Within functional limits Presentation:  Cup;Self Fed (supported/assisted; 6 trials) Other Comments: declined cognitive status impacting follow through w/ tasks    Nectar Thick Nectar Thick Liquid: Not tested   Honey Thick Honey Thick Liquid: Not tested   Puree Puree: Within functional limits Presentation: Spoon (fed; 8 trials)   Solid   GO   Solid: Impaired Presentation: Spoon (fed; 2 trials) Oral Phase Impairments: Poor awareness of bolus (min) Oral Phase Functional Implications: Prolonged oral transit (increased oral phase time) Pharyngeal Phase Impairments:  (none) Other Comments: attempted to break down the bolus consistency for easier mastication and oral phase management        Orinda Kenner, MS, CCC-SLP Anikah Hogge 05/31/2016,6:36 PM

## 2016-05-31 NOTE — Progress Notes (Signed)
[facility] [place] NAME: Guy Hudson    MR#:  [id number]  DATE OF BIRTH:  1923/11/27  SUBJECTIVE:   Pt. Here due to AMS and suspected sepsis.  Patient is arousable to verbal commands sternal rub but remains confused. Patient is draining blood-tinged urine. Talked to patient's son and wife and they say that he was recently diagnosed with prostate cancer.  REVIEW OF SYSTEMS:    Review of Systems  Unable to perform ROS: Mental acuity    Nutrition: Clear liquids Tolerating Diet: Yes Tolerating PT: Await Eval.   DRUG ALLERGIES:  No Known Allergies  VITALS:  Blood pressure (!) 156/54, pulse 95, temperature 98.4 F (36.9 C), resp. rate 19, height 5\' 10"  (1.778 m), weight 82.2 kg (181 lb 3.2 oz), SpO2 98 %.  PHYSICAL EXAMINATION:   Physical Exam  GENERAL:  83 y.o.-year-old patient lying in the bed in no acute distress.  EYES: Pupils equal, round, reactive to light and accommodation. No scleral icterus. Extraocular muscles intact.  HEENT: Head atraumatic, normocephalic. Oropharynx and nasopharynx clear.  NECK:  Supple, no jugular venous distention. No thyroid enlargement, no tenderness.  LUNGS: Normal breath sounds bilaterally, no wheezing, rales, rhonchi. No use of accessory muscles of respiration.  CARDIOVASCULAR: S1, S2 normal. No murmurs, rubs, or gallops.  ABDOMEN: Soft, nontender, nondistended. Bowel sounds present. No organomegaly or mass.  EXTREMITIES: No cyanosis, clubbing or edema b/l.    NEUROLOGIC: Cranial nerves II through XII are intact. No focal Motor or sensory deficits b/l.   PSYCHIATRIC: The patient is alert and oriented x 1.  SKIN: No obvious rash, lesion, or ulcer.   Foley cath in placed and blood tinged urine draining.   LABORATORY PANEL:   CBC  Recent Labs Lab 05/31/16 0710  WBC 8.7  HGB 9.8*  HCT 29.7*  PLT 126*    ------------------------------------------------------------------------------------------------------------------  Chemistries   Recent Labs Lab 05/31/16 0710  NA 137  K 4.7  CL 110  CO2 22  GLUCOSE 267*  BUN 37*  CREATININE 2.12*  CALCIUM 7.4*   ------------------------------------------------------------------------------------------------------------------  Cardiac Enzymes  Recent Labs Lab 05/30/16 2324  TROPONINI 0.03*   ------------------------------------------------------------------------------------------------------------------  RADIOLOGY:  Ct Head Wo Contrast  Result Date: 05/31/2016 CLINICAL DATA:  Altered mental status beginning at 2200 hours tonight after sneezing. Hyperglycemia. History of hypertension and diabetes. EXAM: CT HEAD WITHOUT CONTRAST TECHNIQUE: Contiguous axial images were obtained from the base of the skull through the vertex without intravenous contrast. COMPARISON:  CT HEAD December 19, 2014 FINDINGS: BRAIN: The ventricles and sulci are normal for age. No intraparenchymal hemorrhage, mass effect nor midline shift. Patchy supratentorial white matter hypodensities less than expected for patient's age, though non-specific are most compatible with chronic small vessel ischemic disease. No acute large vascular territory infarcts. No abnormal extra-axial fluid collections. Basal cisterns are patent. VASCULAR: Mild to moderate calcific atherosclerosis of the carotid siphons. SKULL: No skull fracture. No significant scalp soft tissue swelling. SINUSES/ORBITS: The mastoid air-cells and included paranasal sinuses are well-aerated. Status post bilateral ocular lens implants. The included ocular globes and orbital contents are non-suspicious. OTHER: Patient is edentulous. IMPRESSION: Negative CT HEAD for age. Electronically Signed   By: Elon Alas M.D.   On: 05/31/2016 00:08   Ct Chest Wo Contrast  Result Date: 05/31/2016 CLINICAL DATA:  Hematuria. Altered  mental status after sneezing tonight. Hyperglycemia. History of hypertension, diabetes. EXAM: CT CHEST, ABDOMEN AND PELVIS WITHOUT CONTRAST TECHNIQUE: Multidetector CT imaging of the  chest, abdomen and pelvis was performed following the standard protocol without IV contrast. COMPARISON:  Chest radiograph May 30, 2016 and acute abdomen series May 12, 2016 and CT pelvis April 08, 2011 FINDINGS: CT CHEST FINDINGS CARDIOVASCULAR: Heart size is normal. Mild coronary artery calcifications. No pericardial effusions. Thoracic aorta is normal course and caliber, mild calcific atherosclerosis of the aortic arch. MEDIASTINUM/NODES: No mediastinal mass. Scattered subcentimeter mediastinal lymph nodes without lymphadenopathy by CT size criteria. Normal appearance of thoracic esophagus though not tailored for evaluation. LUNGS/PLEURA: Tracheobronchial tree is patent, no pneumothorax. Mild bronchial wall thickening. No pleural effusions, focal consolidations, pulmonary nodules or masses. RIGHT upper lobe atelectasis MUSCULOSKELETAL: Scattered sclerotic lesions throughout the thoracic spine, most conspicuous within spinous process T2, manubrium, and ventral T9 and T10 vertebral bodies without pathologic fracture. Sclerotic lesion RIGHT T10 rib. Soft tissues are normal. CT ABDOMEN PELVIS FINDINGS HEPATOBILIARY: Punctate calcified granuloma, liver otherwise unremarkable. Normal gallbladder. PANCREAS: Normal. SPLEEN: Normal. ADRENALS/URINARY TRACT: Kidneys are orthotopic, demonstrating normal size and morphology. No nephrolithiasis, hydronephrosis; limited assessment for renal masses on this nonenhanced examination. 4.8 cm RIGHT interpolar cyst Tom RIGHT lower pole exophytic 16 mm cyst. Pedunculated 6.2 cm cyst exophytic from lower pole LEFT kidney. The unopacified ureters are normal in course and caliber. Urinary bladder is decompressed containing Foley catheter in retaining bulb. Normal adrenal glands. STOMACH/BOWEL: The  stomach, small and large bowel are normal in course and caliber without inflammatory changes, sensitivity decreased by lack of enteric contrast. VASCULAR/LYMPHATIC: Aortoiliac vessels are normal in course and caliber, mild calcific atherosclerosis. No lymphadenopathy by CT size criteria. REPRODUCTIVE: Prostatomegaly, 5.1 x 4.7 cm invades the base the urinary bladder. OTHER: No intraperitoneal free fluid or free air. MUSCULOSKELETAL: Patchy sclerosis bilateral iliac bones, RIGHT intertrochanteric femur, L1 ivory vertebral body with additional patchy sclerosis in the lumbosacral spine. No pathologic fracture. Severe L4-5 and L5-S1 degenerative discs. Moderate fat containing inguinal hernias. IMPRESSION: CT CHEST: Mild bronchial wall thickening associated with bronchitis or reactive airway disease without pneumonia. Sclerotic osseous metastasis without pathologic fracture, most commonly seen with prostate cancer. CT ABDOMEN AND PELVIS:  No acute abdominopelvic process. Prostatomegaly invading urinary bladder. Sclerotic osseous metastasis without pathologic fracture, most commonly seen with prostate cancer. Electronically Signed   By: Elon Alas M.D.   On: 05/31/2016 02:18   Dg Chest Port 1 View  Result Date: 05/31/2016 CLINICAL DATA:  Altered mental status beginning at 2200 hours after sneezing. Hyperglycemia. EXAM: PORTABLE CHEST 1 VIEW COMPARISON:  None. FINDINGS: Cardiomediastinal silhouette is normal. Mildly calcified aortic knob. No pleural effusions or focal consolidations. Trachea projects midline and there is no pneumothorax. Soft tissue planes and included osseous structures are non-suspicious. IMPRESSION: No acute cardiopulmonary process. Electronically Signed   By: Elon Alas M.D.   On: 05/31/2016 00:05   Ct Renal Stone Study  Result Date: 05/31/2016 CLINICAL DATA:  Hematuria. Altered mental status after sneezing tonight. Hyperglycemia. History of hypertension, diabetes. EXAM: CT CHEST,  ABDOMEN AND PELVIS WITHOUT CONTRAST TECHNIQUE: Multidetector CT imaging of the chest, abdomen and pelvis was performed following the standard protocol without IV contrast. COMPARISON:  Chest radiograph May 30, 2016 and acute abdomen series May 12, 2016 and CT pelvis April 08, 2011 FINDINGS: CT CHEST FINDINGS CARDIOVASCULAR: Heart size is normal. Mild coronary artery calcifications. No pericardial effusions. Thoracic aorta is normal course and caliber, mild calcific atherosclerosis of the aortic arch. MEDIASTINUM/NODES: No mediastinal mass. Scattered subcentimeter mediastinal lymph nodes without lymphadenopathy by CT size criteria. Normal appearance of thoracic esophagus  though not tailored for evaluation. LUNGS/PLEURA: Tracheobronchial tree is patent, no pneumothorax. Mild bronchial wall thickening. No pleural effusions, focal consolidations, pulmonary nodules or masses. RIGHT upper lobe atelectasis MUSCULOSKELETAL: Scattered sclerotic lesions throughout the thoracic spine, most conspicuous within spinous process T2, manubrium, and ventral T9 and T10 vertebral bodies without pathologic fracture. Sclerotic lesion RIGHT T10 rib. Soft tissues are normal. CT ABDOMEN PELVIS FINDINGS HEPATOBILIARY: Punctate calcified granuloma, liver otherwise unremarkable. Normal gallbladder. PANCREAS: Normal. SPLEEN: Normal. ADRENALS/URINARY TRACT: Kidneys are orthotopic, demonstrating normal size and morphology. No nephrolithiasis, hydronephrosis; limited assessment for renal masses on this nonenhanced examination. 4.8 cm RIGHT interpolar cyst Tom RIGHT lower pole exophytic 16 mm cyst. Pedunculated 6.2 cm cyst exophytic from lower pole LEFT kidney. The unopacified ureters are normal in course and caliber. Urinary bladder is decompressed containing Foley catheter in retaining bulb. Normal adrenal glands. STOMACH/BOWEL: The stomach, small and large bowel are normal in course and caliber without inflammatory changes,  sensitivity decreased by lack of enteric contrast. VASCULAR/LYMPHATIC: Aortoiliac vessels are normal in course and caliber, mild calcific atherosclerosis. No lymphadenopathy by CT size criteria. REPRODUCTIVE: Prostatomegaly, 5.1 x 4.7 cm invades the base the urinary bladder. OTHER: No intraperitoneal free fluid or free air. MUSCULOSKELETAL: Patchy sclerosis bilateral iliac bones, RIGHT intertrochanteric femur, L1 ivory vertebral body with additional patchy sclerosis in the lumbosacral spine. No pathologic fracture. Severe L4-5 and L5-S1 degenerative discs. Moderate fat containing inguinal hernias. IMPRESSION: CT CHEST: Mild bronchial wall thickening associated with bronchitis or reactive airway disease without pneumonia. Sclerotic osseous metastasis without pathologic fracture, most commonly seen with prostate cancer. CT ABDOMEN AND PELVIS:  No acute abdominopelvic process. Prostatomegaly invading urinary bladder. Sclerotic osseous metastasis without pathologic fracture, most commonly seen with prostate cancer. Electronically Signed   By: Elon Alas M.D.   On: 05/31/2016 02:18     ASSESSMENT AND PLAN:   83 year old male with past medical history of recently diagnosed prostate cancer, chronic kidney disease, hypertension, diabetes who presented to the hospital due to altered mental status and sepsis.  1. Altered mental status-metabolic encephalopathy secondary to sepsis. -CT head negative for any acute pathology. Continue empiric antibiotics for sepsis and IV fluids and follow mental status.  2. Sepsis-source unclear. Patient presented with fever, tachycardia.  - UA, CT Chest,  CT abdomen pelvis (-) for acute pathology.   - cont. Vanc, Zosyn.  Follow cultures and fever curve.    3. CKD - baseline Cr. Unknown.  - cont. IV fluids and follow BUN/Cr.   4. Hematuria - due to recent diagnosis of Prostate cancer.  - cont. Hytrin.  Hg. Stable.  Cont. Foley.    5. DM - cont. Levemir, SSI.    All  the records are reviewed and case discussed with Care Management/Social Worker. Management plans discussed with the patient, family and they are in agreement.  CODE STATUS: Full code  DVT Prophylaxis: Hep SQ  TOTAL TIME TAKING CARE OF THIS PATIENT: 30 minutes.   POSSIBLE D/C IN 2-3 DAYS, DEPENDING ON CLINICAL CONDITION.   Henreitta Leber M.D on 05/31/2016 at 4:45 PM  Between 7am to 6pm - Pager - [phone]  After 6pm go to [email or web address] - Proofreader  [facility]  Office  [phone]  CC: Primary care physician; Albina Billet, MD

## 2016-05-31 NOTE — Progress Notes (Signed)
Inpatient Diabetes Program Recommendations  AACE/ADA: New Consensus Statement on Inpatient Glycemic Control (2015)  Target Ranges:  Prepandial:   less than 140 mg/dL      Peak postprandial:   less than 180 mg/dL (1-2 hours)      Critically ill patients:  140 - 180 mg/dL   Results for Guy Hudson, Guy Hudson (MRN [id number]) as of 05/31/2016 10:35  Ref. Range 05/30/2016 23:24 05/31/2016 07:10  Glucose Latest Ref Range: 65 - 99 mg/dL 254 (H) 267 (H)   Review of Glycemic Control  Diabetes history: DM2 Outpatient Diabetes medications: Levemir 35 units BID, Novolog 8 units BID with meals Current orders for Inpatient glycemic control: None  Inpatient Diabetes Program Recommendations: Insulin - Basal: Please consider ordering Levemir 8 units Q24H starting now (based on 82 kg x 0.1 units). Correction (SSI): Please consider ordering CBGs with Novolog sensitive correction scale Q4H.  Thanks, Barnie Alderman, RN, MSN, CDE Diabetes Coordinator Inpatient Diabetes Program [phone] (Team Pager from 8am to 5pm)

## 2016-05-31 NOTE — ED Notes (Signed)
primedoc in with pt for admission.  

## 2016-06-01 ENCOUNTER — Inpatient Hospital Stay: Payer: Medicare Other

## 2016-06-01 DIAGNOSIS — R310 Gross hematuria: Secondary | ICD-10-CM

## 2016-06-01 DIAGNOSIS — C61 Malignant neoplasm of prostate: Secondary | ICD-10-CM

## 2016-06-01 LAB — CBC
HEMATOCRIT: 27.5 % — AB (ref 40.0–52.0)
HEMOGLOBIN: 9.3 g/dL — AB (ref 13.0–18.0)
MCH: 31.6 pg (ref 26.0–34.0)
MCHC: 33.7 g/dL (ref 32.0–36.0)
MCV: 93.6 fL (ref 80.0–100.0)
PLATELETS: 113 10*3/uL — AB (ref 150–440)
RBC: 2.94 MIL/uL — AB (ref 4.40–5.90)
RDW: 13.3 % (ref 11.5–14.5)
WBC: 6.2 10*3/uL (ref 3.8–10.6)

## 2016-06-01 LAB — BASIC METABOLIC PANEL
ANION GAP: 6 (ref 5–15)
BUN: 34 mg/dL — ABNORMAL HIGH (ref 6–20)
CHLORIDE: 113 mmol/L — AB (ref 101–111)
CO2: 22 mmol/L (ref 22–32)
Calcium: 7.6 mg/dL — ABNORMAL LOW (ref 8.9–10.3)
Creatinine, Ser: 1.98 mg/dL — ABNORMAL HIGH (ref 0.61–1.24)
GFR calc Af Amer: 32 mL/min — ABNORMAL LOW (ref >60)
GFR, EST NON AFRICAN AMERICAN: 28 mL/min — AB (ref >60)
GLUCOSE: 187 mg/dL — AB (ref 65–99)
POTASSIUM: 4.4 mmol/L (ref 3.5–5.1)
Sodium: 141 mmol/L (ref 135–145)

## 2016-06-01 LAB — URINE CULTURE: CULTURE: NO GROWTH

## 2016-06-01 LAB — GLUCOSE, CAPILLARY
GLUCOSE-CAPILLARY: 216 mg/dL — AB (ref 65–99)
GLUCOSE-CAPILLARY: 150 mg/dL — AB (ref 65–99)
Glucose-Capillary: 179 mg/dL — ABNORMAL HIGH (ref 65–99)
Glucose-Capillary: 217 mg/dL — ABNORMAL HIGH (ref 65–99)

## 2016-06-01 LAB — PSA: PSA: 72.49 ng/mL — AB (ref 0.00–4.00)

## 2016-06-01 MED ORDER — VANCOMYCIN HCL 10 G IV SOLR
1250.0000 mg | INTRAVENOUS | Status: DC
  Administered 2016-06-02: 1250 mg via INTRAVENOUS
  Filled 2016-06-01: qty 1250

## 2016-06-01 NOTE — Evaluation (Signed)
Physical Therapy Evaluation Patient Details Name: Guy Hudson MRN: [id number] DOB: 1923/09/05 Today's Date: 06/01/2016   History of Present Illness  Pt is a 83 y/o M who was brought from home for confusion and fever.  Chest CT in ED showed bronchitis and CT abdomen no acute pathology. CT head negative for any acute pathology.  Admitted for metabolic encephalopathy secondary to sepsis.  Per chart review, pt's PMH includes recent diagnosis of prostate cancer.    Clinical Impression  Pt admitted with above diagnosis. Pt currently with functional limitations due to the deficits listed below (see PT Problem List). Mr. Odam presents with impaired cognitive status, unsure of reliability of information pt provided but he reports he was Ind PTA only using cane/RW when needed.  He currently requires minA for bed mobility and close min guard assist to take a few steps to the chair, declining to ambulate farther due to fatigue.  Given pt's current mobility status, recommending SNF at d/c.  Pt will benefit from skilled PT to increase their independence and safety with mobility to allow discharge to the venue listed below.      Follow Up Recommendations SNF    Equipment Recommendations  Rolling walker with 5" wheels    Recommendations for Other Services       Precautions / Restrictions Precautions Precautions: Fall;Other (comment) Precaution Comments: monitor HR, O2 Restrictions Weight Bearing Restrictions: No      Mobility  Bed Mobility Overal bed mobility: Needs Assistance Bed Mobility: Supine to Sit     Supine to sit: Min assist;HOB elevated     General bed mobility comments: Min assist using bed pad to scoot pt EOB as pt unable to do so independently.  Mod verbal cues for technique and pt uses bed rail to pull himself up to sitting.  Increased time and much increased effort.  HR up to 124 with bed mobility.  Transfers Overall transfer level: Needs assistance Equipment used:  Rolling walker (2 wheeled) Transfers: Sit to/from Stand Sit to Stand: Min guard         General transfer comment: Pt very slow to stand and requires cues for proper technique.  Pt does not reach back for armrests when sitting despite verbal cues (pt may not have heard therapist?).    Ambulation/Gait Ambulation/Gait assistance: Min guard Ambulation Distance (Feet): 3 Feet Assistive device: Rolling walker (2 wheeled) Gait Pattern/deviations: Shuffle;Trunk flexed Gait velocity: decreased Gait velocity interpretation: Below normal speed for age/gender General Gait Details: Pt with flexed posture and shuffles his feet as he takes a few step before sitting in chair.  He reports fatigue with this and declines ambulating in room due to fatigue.  Stairs            Wheelchair Mobility    Modified Rankin (Stroke Patients Only)       Balance Overall balance assessment: Needs assistance Sitting-balance support: No upper extremity supported;Feet supported Sitting balance-Leahy Scale: Fair     Standing balance support: During functional activity;Bilateral upper extremity supported Standing balance-Leahy Scale: Poor Standing balance comment: Pt relies on UE support                              Pertinent Vitals/Pain Pain Assessment: No/denies pain    Home Living Family/patient expects to be discharged to:: Private residence Living Arrangements: Spouse/significant other Available Help at Discharge: Family;Available 24 hours/day Type of Home: House Home Access: Ramped entrance  Home Layout: One level Home Equipment: None Additional Comments: No family present to confirm this home layout and equipment information.      Prior Function Level of Independence: Independent with assistive device(s)         Comments: Pt reports he uses a cane or RW at times when he needs it but otherwise he is independent.  Denies any falls in the past 6 months.  No family present  to confirm information.     Hand Dominance        Extremity/Trunk Assessment   Upper Extremity Assessment Upper Extremity Assessment:  (Strength grossly 4/5 BUE)    Lower Extremity Assessment Lower Extremity Assessment:  (Strength grossly 4/5 BLE)    Cervical / Trunk Assessment Cervical / Trunk Assessment: Kyphotic  Communication   Communication: HOH  Cognition Arousal/Alertness: Awake/alert Behavior During Therapy: WFL for tasks assessed/performed Overall Cognitive Status: No family/caregiver present to determine baseline cognitive functioning Area of Impairment: Orientation;Following commands;Safety/judgement;Problem solving Orientation Level: Disoriented to;Place;Time;Situation     Following Commands: Follows one step commands with increased time Safety/Judgement: Decreased awareness of safety   Problem Solving: Slow processing;Difficulty sequencing;Requires verbal cues General Comments: Reports the year as 2028 and presents with slow processing and requires cues for improved/efficient techniques for bed mobility and sit<>stand tranfers.     General Comments General comments (skin integrity, edema, etc.): SpO2 remains at or above 97% on 1L O2 throughout session.    Exercises General Exercises - Upper Extremity Shoulder Flexion: AROM;Both;10 reps;Supine General Exercises - Lower Extremity Ankle Circles/Pumps: AROM;Both;10 reps;Supine Long Arc Quad: Strengthening;Both;10 reps;Seated Heel Slides: Strengthening;Both;5 reps;Supine Straight Leg Raises: Strengthening;Both;10 reps;Supine   Assessment/Plan    PT Assessment Patient needs continued PT services  PT Problem List Decreased strength;Decreased activity tolerance;Decreased balance;Decreased cognition;Decreased knowledge of use of DME;Decreased safety awareness;Cardiopulmonary status limiting activity          PT Treatment Interventions DME instruction;Gait training;Functional mobility training;Therapeutic  activities;Therapeutic exercise;Balance training;Neuromuscular re-education;Patient/family education;Cognitive remediation    PT Goals (Current goals can be found in the Care Plan section)  Acute Rehab PT Goals Patient Stated Goal: to go  home PT Goal Formulation: With patient Time For Goal Achievement: 06/15/16 Potential to Achieve Goals: Good    Frequency Min 2X/week   Barriers to discharge        Co-evaluation               End of Session Equipment Utilized During Treatment: Gait belt;Oxygen Activity Tolerance: Patient limited by fatigue Patient left: in chair;with call bell/phone within reach;with chair alarm set Nurse Communication: Mobility status;Other (comment) (SpO2, HR)         Time: [id number] PT Time Calculation (min) (ACUTE ONLY): 23 min   Charges:   PT Evaluation $PT Eval Low Complexity: 1 Procedure PT Treatments $Therapeutic Exercise: 8-22 mins   PT G Codes:        Collie Siad PT, DPT 06/01/2016, 2:01 PM

## 2016-06-01 NOTE — Progress Notes (Signed)
Patient ID: Guy Hudson, male   DOB: Mar 19, 1924, 83 y.o.   MRN: [id number]  [facility] PROGRESS NOTE  Guy Hudson [id number] DOB: 12-05-1923 DOA: 05/30/2016 PCP: Albina Billet, MD  HPI/Subjective: Patient feels okay and offers no complaints. Gross hematuria was coming from the Foley catheter this morning. Patient has a history of prostate cancer. No complaints of cough. No diarrhea. No abdominal pain.  Objective: Vitals:   06/01/16 0800 06/01/16 1132  BP: (!) 163/69 (!) 156/45  Pulse: 98 (!) 107  Resp: 18 18  Temp: 98.9 F (37.2 C) 99.7 F (37.6 C)    Filed Weights   05/30/16 2316 05/31/16 0625  Weight: 81.6 kg (180 lb) 82.2 kg (181 lb 3.2 oz)    ROS: Review of Systems  Unable to perform ROS: Acuity of condition  Constitutional: Positive for fever.  Respiratory: Negative for cough and shortness of breath.   Cardiovascular: Negative for chest pain.  Gastrointestinal: Negative for abdominal pain, constipation, diarrhea, nausea and vomiting.  Musculoskeletal: Negative for joint pain.   Exam: Physical Exam  HENT:  Nose: No mucosal edema.  Mouth/Throat: No oropharyngeal exudate or posterior oropharyngeal edema.  Eyes: Conjunctivae, EOM and lids are normal. Pupils are equal, round, and reactive to light.  Neck: No JVD present. Carotid bruit is not present. No edema present. No thyroid mass and no thyromegaly present.  Cardiovascular: S1 normal and S2 normal.  Exam reveals no gallop.   No murmur heard. Pulses:      Dorsalis pedis pulses are 2+ on the right side, and 2+ on the left side.  Respiratory: No respiratory distress. He has no wheezes. He has no rhonchi. He has no rales.  GI: Soft. Bowel sounds are normal. There is no tenderness.  Musculoskeletal:       Right ankle: He exhibits swelling.       Left ankle: He exhibits swelling.  Lymphadenopathy:    He has no cervical adenopathy.  Neurological: He is alert. No cranial nerve deficit.  Skin: Skin is  warm. No rash noted. Nails show no clubbing.  Psychiatric: He has a normal mood and affect.      Data Reviewed: Basic Metabolic Panel:  Recent Labs Lab 05/30/16 2324 05/31/16 0710 06/01/16 0638  NA 140 137 141  K 4.8 4.7 4.4  CL 107 110 113*  CO2 27 22 22   GLUCOSE 254* 267* 187*  BUN 40* 37* 34*  CREATININE 2.30* 2.12* 1.98*  CALCIUM 8.2* 7.4* 7.6*   CBC:  Recent Labs Lab 05/30/16 2324 05/31/16 0710 06/01/16 0638  WBC 8.3 8.7 6.2  HGB 11.3* 9.8* 9.3*  HCT 33.0* 29.7* 27.5*  MCV 93.3 94.2 93.6  PLT 142* 126* 113*   Cardiac Enzymes:  Recent Labs Lab 05/30/16 2324  TROPONINI 0.03*    CBG:  Recent Labs Lab 05/31/16 1733 05/31/16 2157 06/01/16 0743 06/01/16 1133 06/01/16 1632  GLUCAP 310* 248* 179* 217* 216*    Recent Results (from the past 240 hour(s))  Blood Culture (routine x 2)     Status: None (Preliminary result)   Collection Time: 05/30/16 11:24 PM  Result Value Ref Range Status   Specimen Description BLOOD  R AC  Final   Special Requests   Final    BOTTLES DRAWN AEROBIC AND ANAEROBIC  AER 13  ANA 12    Culture NO GROWTH 2 DAYS  Final   Report Status PENDING  Incomplete  Blood Culture (routine x 2)     Status: None (  Preliminary result)   Collection Time: 05/30/16 11:24 PM  Result Value Ref Range Status   Specimen Description BLOOD  R WRIST   Final   Special Requests   Final    BOTTLES DRAWN AEROBIC AND ANAEROBIC  AER 10 ANA 12 ML   Culture NO GROWTH 2 DAYS  Final   Report Status PENDING  Incomplete  Urine culture     Status: None   Collection Time: 05/30/16 11:25 PM  Result Value Ref Range Status   Specimen Description URINE, RANDOM  Final   Special Requests NONE  Final   Culture NO GROWTH Performed at [facility]   Final   Report Status 06/01/2016 FINAL  Final  Rapid Influenza A&B Antigens ([facility] only)     Status: None   Collection Time: 05/31/16  2:20 AM  Result Value Ref Range Status   Influenza A ([facility]) NEGATIVE  NEGATIVE Final   Influenza B ([facility]) NEGATIVE NEGATIVE Final  Culture, blood (x 2)     Status: None (Preliminary result)   Collection Time: 05/31/16  7:10 AM  Result Value Ref Range Status   Specimen Description BLOOD LEFT AC  Final   Special Requests BOTTLES DRAWN AEROBIC AND ANAEROBIC AER7ML ANA7ML  Final   Culture NO GROWTH < 24 HOURS  Final   Report Status PENDING  Incomplete  Culture, blood (x 2)     Status: None (Preliminary result)   Collection Time: 05/31/16  8:03 AM  Result Value Ref Range Status   Specimen Description BLOOD LEFT AC  Final   Special Requests   Final    BOTTLES DRAWN AEROBIC AND ANAEROBIC ANA 10ML AER 10ML   Culture NO GROWTH < 24 HOURS  Final   Report Status PENDING  Incomplete     Studies: Ct Head Wo Contrast  Result Date: 05/31/2016 CLINICAL DATA:  Altered mental status beginning at 2200 hours tonight after sneezing. Hyperglycemia. History of hypertension and diabetes. EXAM: CT HEAD WITHOUT CONTRAST TECHNIQUE: Contiguous axial images were obtained from the base of the skull through the vertex without intravenous contrast. COMPARISON:  CT HEAD December 19, 2014 FINDINGS: BRAIN: The ventricles and sulci are normal for age. No intraparenchymal hemorrhage, mass effect nor midline shift. Patchy supratentorial white matter hypodensities less than expected for patient's age, though non-specific are most compatible with chronic small vessel ischemic disease. No acute large vascular territory infarcts. No abnormal extra-axial fluid collections. Basal cisterns are patent. VASCULAR: Mild to moderate calcific atherosclerosis of the carotid siphons. SKULL: No skull fracture. No significant scalp soft tissue swelling. SINUSES/ORBITS: The mastoid air-cells and included paranasal sinuses are well-aerated. Status post bilateral ocular lens implants. The included ocular globes and orbital contents are non-suspicious. OTHER: Patient is edentulous. IMPRESSION: Negative CT HEAD for age.  Electronically Signed   By: Elon Alas M.D.   On: 05/31/2016 00:08   Ct Chest Wo Contrast  Result Date: 05/31/2016 CLINICAL DATA:  Hematuria. Altered mental status after sneezing tonight. Hyperglycemia. History of hypertension, diabetes. EXAM: CT CHEST, ABDOMEN AND PELVIS WITHOUT CONTRAST TECHNIQUE: Multidetector CT imaging of the chest, abdomen and pelvis was performed following the standard protocol without IV contrast. COMPARISON:  Chest radiograph May 30, 2016 and acute abdomen series May 12, 2016 and CT pelvis April 08, 2011 FINDINGS: CT CHEST FINDINGS CARDIOVASCULAR: Heart size is normal. Mild coronary artery calcifications. No pericardial effusions. Thoracic aorta is normal course and caliber, mild calcific atherosclerosis of the aortic arch. MEDIASTINUM/NODES: No mediastinal mass. Scattered subcentimeter mediastinal lymph  nodes without lymphadenopathy by CT size criteria. Normal appearance of thoracic esophagus though not tailored for evaluation. LUNGS/PLEURA: Tracheobronchial tree is patent, no pneumothorax. Mild bronchial wall thickening. No pleural effusions, focal consolidations, pulmonary nodules or masses. RIGHT upper lobe atelectasis MUSCULOSKELETAL: Scattered sclerotic lesions throughout the thoracic spine, most conspicuous within spinous process T2, manubrium, and ventral T9 and T10 vertebral bodies without pathologic fracture. Sclerotic lesion RIGHT T10 rib. Soft tissues are normal. CT ABDOMEN PELVIS FINDINGS HEPATOBILIARY: Punctate calcified granuloma, liver otherwise unremarkable. Normal gallbladder. PANCREAS: Normal. SPLEEN: Normal. ADRENALS/URINARY TRACT: Kidneys are orthotopic, demonstrating normal size and morphology. No nephrolithiasis, hydronephrosis; limited assessment for renal masses on this nonenhanced examination. 4.8 cm RIGHT interpolar cyst Tom RIGHT lower pole exophytic 16 mm cyst. Pedunculated 6.2 cm cyst exophytic from lower pole LEFT kidney. The unopacified  ureters are normal in course and caliber. Urinary bladder is decompressed containing Foley catheter in retaining bulb. Normal adrenal glands. STOMACH/BOWEL: The stomach, small and large bowel are normal in course and caliber without inflammatory changes, sensitivity decreased by lack of enteric contrast. VASCULAR/LYMPHATIC: Aortoiliac vessels are normal in course and caliber, mild calcific atherosclerosis. No lymphadenopathy by CT size criteria. REPRODUCTIVE: Prostatomegaly, 5.1 x 4.7 cm invades the base the urinary bladder. OTHER: No intraperitoneal free fluid or free air. MUSCULOSKELETAL: Patchy sclerosis bilateral iliac bones, RIGHT intertrochanteric femur, L1 ivory vertebral body with additional patchy sclerosis in the lumbosacral spine. No pathologic fracture. Severe L4-5 and L5-S1 degenerative discs. Moderate fat containing inguinal hernias. IMPRESSION: CT CHEST: Mild bronchial wall thickening associated with bronchitis or reactive airway disease without pneumonia. Sclerotic osseous metastasis without pathologic fracture, most commonly seen with prostate cancer. CT ABDOMEN AND PELVIS:  No acute abdominopelvic process. Prostatomegaly invading urinary bladder. Sclerotic osseous metastasis without pathologic fracture, most commonly seen with prostate cancer. Electronically Signed   By: Elon Alas M.D.   On: 05/31/2016 02:18   Dg Chest Port 1 View  Result Date: 05/31/2016 CLINICAL DATA:  Altered mental status beginning at 2200 hours after sneezing. Hyperglycemia. EXAM: PORTABLE CHEST 1 VIEW COMPARISON:  None. FINDINGS: Cardiomediastinal silhouette is normal. Mildly calcified aortic knob. No pleural effusions or focal consolidations. Trachea projects midline and there is no pneumothorax. Soft tissue planes and included osseous structures are non-suspicious. IMPRESSION: No acute cardiopulmonary process. Electronically Signed   By: Elon Alas M.D.   On: 05/31/2016 00:05   Ct Renal Stone  Study  Result Date: 05/31/2016 CLINICAL DATA:  Hematuria. Altered mental status after sneezing tonight. Hyperglycemia. History of hypertension, diabetes. EXAM: CT CHEST, ABDOMEN AND PELVIS WITHOUT CONTRAST TECHNIQUE: Multidetector CT imaging of the chest, abdomen and pelvis was performed following the standard protocol without IV contrast. COMPARISON:  Chest radiograph May 30, 2016 and acute abdomen series May 12, 2016 and CT pelvis April 08, 2011 FINDINGS: CT CHEST FINDINGS CARDIOVASCULAR: Heart size is normal. Mild coronary artery calcifications. No pericardial effusions. Thoracic aorta is normal course and caliber, mild calcific atherosclerosis of the aortic arch. MEDIASTINUM/NODES: No mediastinal mass. Scattered subcentimeter mediastinal lymph nodes without lymphadenopathy by CT size criteria. Normal appearance of thoracic esophagus though not tailored for evaluation. LUNGS/PLEURA: Tracheobronchial tree is patent, no pneumothorax. Mild bronchial wall thickening. No pleural effusions, focal consolidations, pulmonary nodules or masses. RIGHT upper lobe atelectasis MUSCULOSKELETAL: Scattered sclerotic lesions throughout the thoracic spine, most conspicuous within spinous process T2, manubrium, and ventral T9 and T10 vertebral bodies without pathologic fracture. Sclerotic lesion RIGHT T10 rib. Soft tissues are normal. CT ABDOMEN PELVIS FINDINGS HEPATOBILIARY: Punctate calcified granuloma,  liver otherwise unremarkable. Normal gallbladder. PANCREAS: Normal. SPLEEN: Normal. ADRENALS/URINARY TRACT: Kidneys are orthotopic, demonstrating normal size and morphology. No nephrolithiasis, hydronephrosis; limited assessment for renal masses on this nonenhanced examination. 4.8 cm RIGHT interpolar cyst Tom RIGHT lower pole exophytic 16 mm cyst. Pedunculated 6.2 cm cyst exophytic from lower pole LEFT kidney. The unopacified ureters are normal in course and caliber. Urinary bladder is decompressed containing Foley  catheter in retaining bulb. Normal adrenal glands. STOMACH/BOWEL: The stomach, small and large bowel are normal in course and caliber without inflammatory changes, sensitivity decreased by lack of enteric contrast. VASCULAR/LYMPHATIC: Aortoiliac vessels are normal in course and caliber, mild calcific atherosclerosis. No lymphadenopathy by CT size criteria. REPRODUCTIVE: Prostatomegaly, 5.1 x 4.7 cm invades the base the urinary bladder. OTHER: No intraperitoneal free fluid or free air. MUSCULOSKELETAL: Patchy sclerosis bilateral iliac bones, RIGHT intertrochanteric femur, L1 ivory vertebral body with additional patchy sclerosis in the lumbosacral spine. No pathologic fracture. Severe L4-5 and L5-S1 degenerative discs. Moderate fat containing inguinal hernias. IMPRESSION: CT CHEST: Mild bronchial wall thickening associated with bronchitis or reactive airway disease without pneumonia. Sclerotic osseous metastasis without pathologic fracture, most commonly seen with prostate cancer. CT ABDOMEN AND PELVIS:  No acute abdominopelvic process. Prostatomegaly invading urinary bladder. Sclerotic osseous metastasis without pathologic fracture, most commonly seen with prostate cancer. Electronically Signed   By: Elon Alas M.D.   On: 05/31/2016 02:18    Scheduled Meds: . atorvastatin  40 mg Oral q1800  . insulin aspart  0-5 Units Subcutaneous QHS  . insulin aspart  0-9 Units Subcutaneous TID WC  . insulin detemir  8 Units Subcutaneous QHS  . piperacillin-tazobactam (ZOSYN)  IV  3.375 g Intravenous Q8H  . sodium chloride flush  3 mL Intravenous Q12H  . terazosin  5 mg Oral QHS  . [START ON 06/02/2016] vancomycin  1,250 mg Intravenous Q36H   Continuous Infusions: . sodium chloride 40 mL/hr at 06/01/16 1022    Assessment/Plan:  1. Sepsis with unclear source. Patient with fever and tachycardia and acute encephalopathy. Patient placed on vancomycin and Zosyn.  Patient had a low-grade fever this morning but  trending better. So far cultures are negative. Repeat chest x-ray to see if pneumonia after hydration. 2. Acute encephalopathy seems better at this point. 3. Metastatic prostate cancer. Appreciate urology consultation. PSA 98. Bone scan ordered by urology for Monday. 4. Gross hematuria. As per urology can discontinue Foley catheter. I will discontinue Foley catheter tomorrow morning. Continue gentle IV fluid hydration 5. Weakness. Physical therapy recommended rehabilitation 6. Type 2 diabetes mellitus on low-dose detemir insulin and sliding scale 7. Hyperlipidemia unspecified on atorvastatin 8. BPH on Terazosin  Code Status:     Code Status Orders        Start     Ordered   05/31/16 0249  Full code  Continuous     05/31/16 0249    Code Status History    Date Active Date Inactive Code Status Order ID Comments User Context   This patient has a current code status but no historical code status.     Family Communication: son at the bedside Disposition Plan: rehabilitation once afebrile  Consultants:  urology  Antibiotics:  Vancomycin and Zosyn  Time spent: 28 minutes  [place], [place]

## 2016-06-01 NOTE — Progress Notes (Signed)
Pharmacy Antibiotic Note  Guy Hudson is a 83 y.o. male admitted on 05/30/2016 with sepsis of unknown source.  Pharmacy has been consulted for Zosyn and vancomycin dosing.  Plan: Antibiotics Day 3 Renal function improving; will increase vancomycin to 1250mg  IV Q36H to target trough of 15-23mcg/ml. Trough prior to third dose of this regimen, not quite steady state.  Continue Zosyn 3.375gm IV Q8H EI  Continue to monitor renal function.   Height: 5\' 10"  (177.8 cm) Weight: 181 lb 3.2 oz (82.2 kg) IBW/kg (Calculated) : 73  Temp (24hrs), Avg:99.5 F (37.5 C), Min:98.4 F (36.9 C), Max:100.6 F (38.1 C)   Recent Labs Lab 05/30/16 2324 05/30/16 2325 05/31/16 0229 05/31/16 0710 05/31/16 0711 06/01/16 0638  WBC 8.3  --   --  8.7  --  6.2  CREATININE 2.30*  --   --  2.12*  --  1.98*  LATICACIDVEN  --  1.7 1.3  --  1.2  --     Estimated Creatinine Clearance: 25.6 mL/min (by C-G formula based on SCr of 1.98 mg/dL (H)).    No Known Allergies   Microbiology results:  1/4 BCx: NGTD x 2 1/4 UCx: NG final  1/4 Rapid Flu: neg 1/5 Blood: NGTD x 2    Thank you for allowing pharmacy to be a part of this patient's care.  Tate Zagal C, Pharm.D., BCPS Clinical Pharmacist 06/01/2016 10:24 AM

## 2016-06-01 NOTE — Progress Notes (Signed)
Foley draining dark red urine. No clots visible. Catheter irrigated with 300 mls sterile n/s. No resistance met. No difficulty aspirating.  Pt denied any cramping. 

## 2016-06-01 NOTE — Consult Note (Signed)
Urology Consult  I have been asked to see the patient by Dr. Earleen Newport, for evaluation and management of gross hematuria/ prostate cancer.  Chief Complaint: gross hematuria/ AMS  History of Present Illness: Guy Hudson is a 83 y.o. year old male admitted to the medical service with altered mental status of unclear origin, likely presumed sepsis.  At the time of admission, a Foley catheter was placed and these had gross hematuria since then. Per the patient's wife, he was diagnosed with presumed prostate cancer by his PCP, Dr. Hall Busing based on laboratory data last month (no prostate biopsy a recent rectal exam). He has not received any further intervention or therapy for this. CT scan at the time of admission to show evidence of sclerotic bone lesions possibly consistent with metastatic bony disease and an enlarged prostate gland with what appears to be some local invasion into the bladder without hydroureter nephrosis.  Guy Hudson is somewhat lethargic this evening and not able to provide much additional history. His wife and son deny that he's had any significant weight loss or bone pain. He does have some chronic arthritic pain as well as pain in his lower extremities felt to be related to diabetes.  He did have a Foley catheter during an admission about 4 years ago which also resulted in gross hematuria. He has not having bloody urine outside of these events.  Patient's creatinine is at baseline. Since the catheter is placed, there is been some small clots and maroon colored urine, but otherwise draining well. At the bedside this afternoon, a few small clots were evacuated after irrigating the bladder by hand with approximately 500 cc of normal saline.  Urine thereafter was light pink and catheter is draining well.    Past Medical History:  Diagnosis Date  . Diabetes mellitus without complication ([place])   . Hypertension     Past Surgical History:  Procedure Laterality Date  . unknown       Home Medications:  Current Meds  Medication Sig  . acetaminophen (TYLENOL) 500 MG tablet Take 500-1,000 mg by mouth every 6 (six) hours as needed for mild pain or headache.  Marland Kitchen aspirin EC 81 MG tablet Take 162 mg by mouth daily.   . ferrous sulfate 325 (65 FE) MG tablet Take 325 mg by mouth 3 (three) times a week. Pt takes on Monday, Wednesday, and Friday.  . furosemide (LASIX) 40 MG tablet Take 40 mg by mouth daily. Pt does not take on Monday, Wednesday, and Friday.  . insulin aspart (NOVOLOG) 100 UNIT/ML injection Inject 8 Units into the skin 2 (two) times daily.  . insulin detemir (LEVEMIR) 100 UNIT/ML injection Inject 36 Units into the skin 2 (two) times daily.   . metoprolol (LOPRESSOR) 50 MG tablet Take 50 mg by mouth 2 (two) times daily.  . polyethylene glycol (MIRALAX) packet Take 17 g by mouth daily.  . simvastatin (ZOCOR) 80 MG tablet Take 40 mg by mouth at bedtime.  Marland Kitchen terazosin (HYTRIN) 5 MG capsule Take 5 mg by mouth at bedtime.    Allergies: No Known Allergies  Family History  Problem Relation Age of Onset  . Family history unknown: Yes    Social History:  reports that he has never smoked. He has never used smokeless tobacco. He reports that he does not drink alcohol or use drugs.  ROS: Patient somewhat lethargic and sleeping, unable to provide additional review of systems due to mental status.  Physical Exam:  Vital signs in last 24 hours: Temp:  [98.9 F (37.2 C)-100.6 F (38.1 C)] 99.7 F (37.6 C) (01/06 1132) Pulse Rate:  [98-107] 107 (01/06 1132) Resp:  [18] 18 (01/06 1132) BP: (148-163)/(45-69) 156/45 (01/06 1132) SpO2:  [96 %-100 %] 97 % (01/06 1132) Constitutional: Drowsy but arousable, not conversive, No acute distress.  Son at bedside, wife present by telephone. HEENT: [place] AT, moist mucus membranes.  Trachea midline, no masses Cardiovascular: Regular rate and rhythm, no clubbing, cyanosis, or edema. Respiratory: Normal respiratory effort, no  respiratory distress. GI: Abdomen is soft, nontender, nondistended, no abdominal masses GU: 16 French Foley catheter in place, draining light pink urine after hand irrigation. Uncircumcised phallus. Testicles descended bilaterally. Rectal exam deferred as patient was unable to participate in this exam it is current state today Skin: No rashes, bruises or suspicious lesions Neurologic: Grossly intact, no focal deficits, moving all 4 extremities Psychiatric: Normal mood and affect   Laboratory Data:   Recent Labs  05/30/16 2324 05/31/16 0710 06/01/16 0638  WBC 8.3 8.7 6.2  HGB 11.3* 9.8* 9.3*  HCT 33.0* 29.7* 27.5*    Recent Labs  05/30/16 2324 05/31/16 0710 06/01/16 0638  NA 140 137 141  K 4.8 4.7 4.4  CL 107 110 113*  CO2 27 22 22   GLUCOSE 254* 267* 187*  BUN 40* 37* 34*  CREATININE 2.30* 2.12* 1.98*  CALCIUM 8.2* 7.4* 7.6*   No results for input(s): LABPT, INR in the last 72 hours. No results for input(s): LABURIN in the last 72 hours. Results for orders placed or performed during the hospital encounter of 05/30/16  Blood Culture (routine x 2)     Status: None (Preliminary result)   Collection Time: 05/30/16 11:24 PM  Result Value Ref Range Status   Specimen Description BLOOD  R AC  Final   Special Requests   Final    BOTTLES DRAWN AEROBIC AND ANAEROBIC  AER 13  ANA 12    Culture NO GROWTH 2 DAYS  Final   Report Status PENDING  Incomplete  Blood Culture (routine x 2)     Status: None (Preliminary result)   Collection Time: 05/30/16 11:24 PM  Result Value Ref Range Status   Specimen Description BLOOD  R WRIST   Final   Special Requests   Final    BOTTLES DRAWN AEROBIC AND ANAEROBIC  AER 10 ANA 12 ML   Culture NO GROWTH 2 DAYS  Final   Report Status PENDING  Incomplete  Urine culture     Status: None   Collection Time: 05/30/16 11:25 PM  Result Value Ref Range Status   Specimen Description URINE, RANDOM  Final   Special Requests NONE  Final   Culture NO  GROWTH Performed at [facility]   Final   Report Status 06/01/2016 FINAL  Final  Rapid Influenza A&B Antigens ([place] only)     Status: None   Collection Time: 05/31/16  2:20 AM  Result Value Ref Range Status   Influenza A ([place]) NEGATIVE NEGATIVE Final   Influenza B ([facility]) NEGATIVE NEGATIVE Final  Culture, blood (x 2)     Status: None (Preliminary result)   Collection Time: 05/31/16  7:10 AM  Result Value Ref Range Status   Specimen Description BLOOD LEFT AC  Final   Special Requests BOTTLES DRAWN AEROBIC AND ANAEROBIC AER7ML ANA7ML  Final   Culture NO GROWTH < 24 HOURS  Final   Report Status PENDING  Incomplete  Culture, blood (x  2)     Status: None (Preliminary result)   Collection Time: 05/31/16  8:03 AM  Result Value Ref Range Status   Specimen Description BLOOD LEFT AC  Final   Special Requests   Final    BOTTLES DRAWN AEROBIC AND ANAEROBIC ANA 10ML AER 10ML   Culture NO GROWTH < 24 HOURS  Final   Report Status PENDING  Incomplete     Radiologic Imaging: Ct Head Wo Contrast  Result Date: 05/31/2016 CLINICAL DATA:  Altered mental status beginning at 2200 hours tonight after sneezing. Hyperglycemia. History of hypertension and diabetes. EXAM: CT HEAD WITHOUT CONTRAST TECHNIQUE: Contiguous axial images were obtained from the base of the skull through the vertex without intravenous contrast. COMPARISON:  CT HEAD December 19, 2014 FINDINGS: BRAIN: The ventricles and sulci are normal for age. No intraparenchymal hemorrhage, mass effect nor midline shift. Patchy supratentorial white matter hypodensities less than expected for patient's age, though non-specific are most compatible with chronic small vessel ischemic disease. No acute large vascular territory infarcts. No abnormal extra-axial fluid collections. Basal cisterns are patent. VASCULAR: Mild to moderate calcific atherosclerosis of the carotid siphons. SKULL: No skull fracture. No significant scalp soft tissue swelling.  SINUSES/ORBITS: The mastoid air-cells and included paranasal sinuses are well-aerated. Status post bilateral ocular lens implants. The included ocular globes and orbital contents are non-suspicious. OTHER: Patient is edentulous. IMPRESSION: Negative CT HEAD for age. Electronically Signed   By: Elon Alas M.D.   On: 05/31/2016 00:08   Ct Chest Wo Contrast  Result Date: 05/31/2016 CLINICAL DATA:  Hematuria. Altered mental status after sneezing tonight. Hyperglycemia. History of hypertension, diabetes. EXAM: CT CHEST, ABDOMEN AND PELVIS WITHOUT CONTRAST TECHNIQUE: Multidetector CT imaging of the chest, abdomen and pelvis was performed following the standard protocol without IV contrast. COMPARISON:  Chest radiograph May 30, 2016 and acute abdomen series May 12, 2016 and CT pelvis April 08, 2011 FINDINGS: CT CHEST FINDINGS CARDIOVASCULAR: Heart size is normal. Mild coronary artery calcifications. No pericardial effusions. Thoracic aorta is normal course and caliber, mild calcific atherosclerosis of the aortic arch. MEDIASTINUM/NODES: No mediastinal mass. Scattered subcentimeter mediastinal lymph nodes without lymphadenopathy by CT size criteria. Normal appearance of thoracic esophagus though not tailored for evaluation. LUNGS/PLEURA: Tracheobronchial tree is patent, no pneumothorax. Mild bronchial wall thickening. No pleural effusions, focal consolidations, pulmonary nodules or masses. RIGHT upper lobe atelectasis MUSCULOSKELETAL: Scattered sclerotic lesions throughout the thoracic spine, most conspicuous within spinous process T2, manubrium, and ventral T9 and T10 vertebral bodies without pathologic fracture. Sclerotic lesion RIGHT T10 rib. Soft tissues are normal. CT ABDOMEN PELVIS FINDINGS HEPATOBILIARY: Punctate calcified granuloma, liver otherwise unremarkable. Normal gallbladder. PANCREAS: Normal. SPLEEN: Normal. ADRENALS/URINARY TRACT: Kidneys are orthotopic, demonstrating normal size and  morphology. No nephrolithiasis, hydronephrosis; limited assessment for renal masses on this nonenhanced examination. 4.8 cm RIGHT interpolar cyst Tom RIGHT lower pole exophytic 16 mm cyst. Pedunculated 6.2 cm cyst exophytic from lower pole LEFT kidney. The unopacified ureters are normal in course and caliber. Urinary bladder is decompressed containing Foley catheter in retaining bulb. Normal adrenal glands. STOMACH/BOWEL: The stomach, small and large bowel are normal in course and caliber without inflammatory changes, sensitivity decreased by lack of enteric contrast. VASCULAR/LYMPHATIC: Aortoiliac vessels are normal in course and caliber, mild calcific atherosclerosis. No lymphadenopathy by CT size criteria. REPRODUCTIVE: Prostatomegaly, 5.1 x 4.7 cm invades the base the urinary bladder. OTHER: No intraperitoneal free fluid or free air. MUSCULOSKELETAL: Patchy sclerosis bilateral iliac bones, RIGHT intertrochanteric femur, L1 ivory vertebral body  with additional patchy sclerosis in the lumbosacral spine. No pathologic fracture. Severe L4-5 and L5-S1 degenerative discs. Moderate fat containing inguinal hernias. IMPRESSION: CT CHEST: Mild bronchial wall thickening associated with bronchitis or reactive airway disease without pneumonia. Sclerotic osseous metastasis without pathologic fracture, most commonly seen with prostate cancer. CT ABDOMEN AND PELVIS:  No acute abdominopelvic process. Prostatomegaly invading urinary bladder. Sclerotic osseous metastasis without pathologic fracture, most commonly seen with prostate cancer. Electronically Signed   By: Elon Alas M.D.   On: 05/31/2016 02:18   Dg Chest Port 1 View  Result Date: 05/31/2016 CLINICAL DATA:  Altered mental status beginning at 2200 hours after sneezing. Hyperglycemia. EXAM: PORTABLE CHEST 1 VIEW COMPARISON:  None. FINDINGS: Cardiomediastinal silhouette is normal. Mildly calcified aortic knob. No pleural effusions or focal consolidations. Trachea  projects midline and there is no pneumothorax. Soft tissue planes and included osseous structures are non-suspicious. IMPRESSION: No acute cardiopulmonary process. Electronically Signed   By: Elon Alas M.D.   On: 05/31/2016 00:05   Ct Renal Stone Study  Result Date: 05/31/2016 CLINICAL DATA:  Hematuria. Altered mental status after sneezing tonight. Hyperglycemia. History of hypertension, diabetes. EXAM: CT CHEST, ABDOMEN AND PELVIS WITHOUT CONTRAST TECHNIQUE: Multidetector CT imaging of the chest, abdomen and pelvis was performed following the standard protocol without IV contrast. COMPARISON:  Chest radiograph May 30, 2016 and acute abdomen series May 12, 2016 and CT pelvis April 08, 2011 FINDINGS: CT CHEST FINDINGS CARDIOVASCULAR: Heart size is normal. Mild coronary artery calcifications. No pericardial effusions. Thoracic aorta is normal course and caliber, mild calcific atherosclerosis of the aortic arch. MEDIASTINUM/NODES: No mediastinal mass. Scattered subcentimeter mediastinal lymph nodes without lymphadenopathy by CT size criteria. Normal appearance of thoracic esophagus though not tailored for evaluation. LUNGS/PLEURA: Tracheobronchial tree is patent, no pneumothorax. Mild bronchial wall thickening. No pleural effusions, focal consolidations, pulmonary nodules or masses. RIGHT upper lobe atelectasis MUSCULOSKELETAL: Scattered sclerotic lesions throughout the thoracic spine, most conspicuous within spinous process T2, manubrium, and ventral T9 and T10 vertebral bodies without pathologic fracture. Sclerotic lesion RIGHT T10 rib. Soft tissues are normal. CT ABDOMEN PELVIS FINDINGS HEPATOBILIARY: Punctate calcified granuloma, liver otherwise unremarkable. Normal gallbladder. PANCREAS: Normal. SPLEEN: Normal. ADRENALS/URINARY TRACT: Kidneys are orthotopic, demonstrating normal size and morphology. No nephrolithiasis, hydronephrosis; limited assessment for renal masses on this nonenhanced  examination. 4.8 cm RIGHT interpolar cyst Tom RIGHT lower pole exophytic 16 mm cyst. Pedunculated 6.2 cm cyst exophytic from lower pole LEFT kidney. The unopacified ureters are normal in course and caliber. Urinary bladder is decompressed containing Foley catheter in retaining bulb. Normal adrenal glands. STOMACH/BOWEL: The stomach, small and large bowel are normal in course and caliber without inflammatory changes, sensitivity decreased by lack of enteric contrast. VASCULAR/LYMPHATIC: Aortoiliac vessels are normal in course and caliber, mild calcific atherosclerosis. No lymphadenopathy by CT size criteria. REPRODUCTIVE: Prostatomegaly, 5.1 x 4.7 cm invades the base the urinary bladder. OTHER: No intraperitoneal free fluid or free air. MUSCULOSKELETAL: Patchy sclerosis bilateral iliac bones, RIGHT intertrochanteric femur, L1 ivory vertebral body with additional patchy sclerosis in the lumbosacral spine. No pathologic fracture. Severe L4-5 and L5-S1 degenerative discs. Moderate fat containing inguinal hernias. IMPRESSION: CT CHEST: Mild bronchial wall thickening associated with bronchitis or reactive airway disease without pneumonia. Sclerotic osseous metastasis without pathologic fracture, most commonly seen with prostate cancer. CT ABDOMEN AND PELVIS:  No acute abdominopelvic process. Prostatomegaly invading urinary bladder. Sclerotic osseous metastasis without pathologic fracture, most commonly seen with prostate cancer. Electronically Signed   By: Elon Alas  M.D.   On: 05/31/2016 02:18    Impression/Assessment:  83 year old male admitted with altered mental status presumed secondary to infectious etiology, although source has not yet been identified. Urology consult at for gross hematuria, possible history of prostate cancer.  Plan:  #1. Prostate cancer (presumed)- based on CT scan findings including sclerotic bone disease and what appears to be a locally invasive gland into the bladder neck, he  likely does have metastatic prostate cancer. - We will obtain PSA for baseline.  -Consider bone scan Monday. - We will attempt rectal exam tomorrow if more alert/cooperative for clinical staging. -If he does have clinical metastatic prostate cancer, may benefit from androgen deprivation therapy to prevent pathologic fracture/symptomatic relief  #2. Gross hematuria- likely traumatic secondary to Foley catheter placement with a friable/enlarged prostate. Few small clots evacuated and urine cleared nicely to light pink. Bleeding does not appear to be significant. No need for upsizing of catheter for CBI at this time.   -Recommend when necessary catheter flushing, suspect bleeding will be self-limited. -DC Foley per primary team once ambulating.  #3. CKD- creatinine appears to be at baseline. No hydroureteronephrosis or evidence of obstruction.    06/01/2016, 12:37 PM  Hollice Espy,  MD   Thank you for involving me in this patient's care, I will continue to follow along. Please page with any further questions or concerns.

## 2016-06-01 NOTE — Progress Notes (Deleted)
Overnight pulse ox initiated for this patient. Saturation noted at 97 hr 77 [id number].  Patient in no distress.  Patient has refused bipap last two nights. I did wake her up and ask her again if she wanted to use tonight. She has refused. She has indicated to me previously.. Its hard to sleep with thing on my face.  Pulse ox testing order has no specifications as to what mode of therapy patient is to use therefore proceeded with 1 liter of o2.

## 2016-06-01 NOTE — Plan of Care (Signed)
Problem: SLP Dysphagia Goals Goal: Misc Dysphagia Goal Pt will safely tolerate po diet of least restrictive consistency w/ no overt s/s of aspiration noted by Staff/pt/family x3 sessions.    

## 2016-06-01 NOTE — Progress Notes (Signed)
Speech Language Pathology Treatment: Dysphagia  Patient Details Name: QUILLEN PON MRN: [id number] DOB: 02-13-1924 Today's Date: 06/01/2016 Time: [id number] SLP Time Calculation (min) (ACUTE ONLY): 45 min  Assessment / Plan / Recommendation Clinical Impression  Pt more alert w/ focus to task; feeding self breakfast meal upon entering room. Pt appears at reduced risk for aspiration at this time following general aspiration precautions. However, d/t declined Cognitive status and need for verbal cueing to follow through w/ tasks as well as frequent s/s of reflux to include constant belching w/ oral intake, there is min risk for aspiration. When following general aspiration precautions and given assistance and monitoring/cues during feeding of po trials, pt consumed trials of thin liquids via cup then straw and puree w/ soft solids w/ no overt s/s of aspiration noted; oral phase c/b min increased mastication time/effort w/ increased textured trials(solids). Pt fed self w/ setup though was weak. Pt was wearing his dentures. Recommend continue a Dysphagia level 2 diet w/ thin liquids at this time for easier mastication (denture use). Recommend general aspiration precautions, monitoring and assistance at all meals, and meds in Puree. NSG updated   HPI HPI: Pt is a 83 y.o. male with a known history of Hypertension, diabetes mellitus type 2, hyperlipidemia was brought from home for confusion and fever. Patient had a fever of 10 57F or also had occasional cough. No family members were available to get any history. Patient is arousable to loud verbal commands and painful stimuli. He is not completely oriented to time place and person. He was febrile when he presented to the emergency room and his oxygen saturation was around 88% on room air. Blood sugar on arrival was 3 29 mg/dL. Patient was put on oxygen via nasal cannula and IV fluids were given sepsis protocol. Patient received broad-spectrum IV antibiotics in  the emergency room. Not much history could be obtained from the patient. Patient was worked up with CT chest and CT abdomen in the emergency room. CT chest showed bronchitis. Unsure of pt's GI baseline w/ regard to potential esophageal dysmotility and/or Reflux d/t pt's consistent belching during po trials/eval. Pt is not on a PPI currently per medication list. Also unsure of pt's baseline Cognitive status - he is moderately confused at this time and requires moderate verbal/tactile cues for follow through w/ tasks.       SLP Plan  Continue with current plan of care     Recommendations  Diet recommendations: Dysphagia 2 (fine chop);Thin liquid Liquids provided via: Cup;Straw (monitor straw use) Medication Administration: Whole meds with puree (as able to tolerate vs crushing the meds) Supervision: Patient able to self feed;Staff to assist with self feeding;Intermittent supervision to cue for compensatory strategies Compensations: Minimize environmental distractions;Slow rate;Small sips/bites;Lingual sweep for clearance of pocketing;Multiple dry swallows after each bite/sip;Follow solids with liquid Postural Changes and/or Swallow Maneuvers: Seated upright 90 degrees;Upright 30-60 min after meal (Reflux precautions)                General recommendations:  (Dietician f/u) Oral Care Recommendations: Oral care BID;Staff/trained caregiver to provide oral care Follow up Recommendations: None (TBD) Plan: Continue with current plan of care       [place], MS, CCC-SLP [place] 06/01/2016, 10:49 AM

## 2016-06-02 DIAGNOSIS — L8990 Pressure ulcer of unspecified site, unspecified stage: Secondary | ICD-10-CM | POA: Insufficient documentation

## 2016-06-02 DIAGNOSIS — L89152 Pressure ulcer of sacral region, stage 2: Secondary | ICD-10-CM

## 2016-06-02 LAB — CBC
HCT: 27.6 % — ABNORMAL LOW (ref 40.0–52.0)
Hemoglobin: 9.4 g/dL — ABNORMAL LOW (ref 13.0–18.0)
MCH: 32 pg (ref 26.0–34.0)
MCHC: 33.9 g/dL (ref 32.0–36.0)
MCV: 94.2 fL (ref 80.0–100.0)
Platelets: 102 10*3/uL — ABNORMAL LOW (ref 150–440)
RBC: 2.93 MIL/uL — ABNORMAL LOW (ref 4.40–5.90)
RDW: 13.4 % (ref 11.5–14.5)
WBC: 4.9 10*3/uL (ref 3.8–10.6)

## 2016-06-02 LAB — BASIC METABOLIC PANEL
ANION GAP: 5 (ref 5–15)
BUN: 30 mg/dL — AB (ref 6–20)
CALCIUM: 7.8 mg/dL — AB (ref 8.9–10.3)
CO2: 22 mmol/L (ref 22–32)
Chloride: 115 mmol/L — ABNORMAL HIGH (ref 101–111)
Creatinine, Ser: 2.06 mg/dL — ABNORMAL HIGH (ref 0.61–1.24)
GFR calc Af Amer: 31 mL/min — ABNORMAL LOW (ref >60)
GFR, EST NON AFRICAN AMERICAN: 27 mL/min — AB (ref >60)
GLUCOSE: 166 mg/dL — AB (ref 65–99)
Potassium: 4.2 mmol/L (ref 3.5–5.1)
Sodium: 142 mmol/L (ref 135–145)

## 2016-06-02 LAB — GLUCOSE, CAPILLARY
GLUCOSE-CAPILLARY: 128 mg/dL — AB (ref 65–99)
GLUCOSE-CAPILLARY: 187 mg/dL — AB (ref 65–99)
Glucose-Capillary: 172 mg/dL — ABNORMAL HIGH (ref 65–99)
Glucose-Capillary: 275 mg/dL — ABNORMAL HIGH (ref 65–99)

## 2016-06-02 MED ORDER — CEFTRIAXONE SODIUM-DEXTROSE 1-3.74 GM-% IV SOLR
1.0000 g | INTRAVENOUS | Status: DC
  Administered 2016-06-02: 1 g via INTRAVENOUS
  Filled 2016-06-02: qty 50

## 2016-06-02 MED ORDER — CEFTRIAXONE SODIUM 1 G IJ SOLR: 1.0000 g | INTRAMUSCULAR | Status: DC

## 2016-06-02 MED ORDER — VANCOMYCIN HCL 10 G IV SOLR: 1250.0000 mg | INTRAVENOUS | Status: DC

## 2016-06-02 MED ORDER — DEXTROSE 5 % IV SOLN
1.0000 g | INTRAVENOUS | Status: DC
  Filled 2016-06-02: qty 10

## 2016-06-02 MED ORDER — PIPERACILLIN-TAZOBACTAM 3.375 G IVPB: 3.3750 g | Freq: Three times a day (TID) | INTRAVENOUS | Status: DC

## 2016-06-02 MED ORDER — POLYETHYLENE GLYCOL 3350 17 G PO PACK
17.0000 g | PACK | Freq: Every day | ORAL | Status: DC
  Administered 2016-06-02 – 2016-06-05 (×3): 17 g via ORAL
  Filled 2016-06-02 (×3): qty 1

## 2016-06-02 NOTE — Consult Note (Signed)
Date of Consultation:  06/02/2016  Requesting Physician:   Loletha Grayer, MD  Reason for Consultation:  Possible perirectal abscess  History of Present Illness: Guy Hudson is a 83 y.o. male admitted on 1/4 with altered mental status and a fever to 103.3.  Initial workup included CT of chest which showed bronchitis and CT abdomen/pelvis which did not show any pathology.  Was started on broad spectrum antibiotics and currently is on Vancomycin and Zosyn.  His WBC has been normal throughout.  He has been afebrile now for 2 days.  On exam today, Dr. Leslye Peer noted fluctuance over the left buttocks area and the patient had pain on rectal exam, though he has an enlarged prostate.  Surgery was consulted for further evaluation. Currently the patient denies any pain over the buttocks or perianal areas. Reports that he has not had a bowel movement since prior to admission.  Past Medical History: Past Medical History:  Diagnosis Date  . Diabetes mellitus without complication ([place])   . Hypertension Hyperlipidemia      Past Surgical History: Past Surgical History:  Procedure Laterality Date  . unknown      Home Medications: Prior to Admission medications   Medication Sig Start Date End Date Taking? Authorizing Provider  acetaminophen (TYLENOL) 500 MG tablet Take 500-1,000 mg by mouth every 6 (six) hours as needed for mild pain or headache.   Yes Historical Provider, MD  aspirin EC 81 MG tablet Take 162 mg by mouth daily.    Yes Historical Provider, MD  ferrous sulfate 325 (65 FE) MG tablet Take 325 mg by mouth 3 (three) times a week. Pt takes on Monday, Wednesday, and Friday.   Yes Historical Provider, MD  furosemide (LASIX) 40 MG tablet Take 40 mg by mouth daily. Pt does not take on Monday, Wednesday, and Friday.   Yes Historical Provider, MD  insulin aspart (NOVOLOG) 100 UNIT/ML injection Inject 8 Units into the skin 2 (two) times daily.   Yes Historical Provider, MD  insulin detemir  (LEVEMIR) 100 UNIT/ML injection Inject 36 Units into the skin 2 (two) times daily.    Yes Historical Provider, MD  metoprolol (LOPRESSOR) 50 MG tablet Take 50 mg by mouth 2 (two) times daily.   Yes Historical Provider, MD  polyethylene glycol (MIRALAX) packet Take 17 g by mouth daily. 05/12/16  Yes Daymon Larsen, MD  simvastatin (ZOCOR) 80 MG tablet Take 40 mg by mouth at bedtime.   Yes Historical Provider, MD  terazosin (HYTRIN) 5 MG capsule Take 5 mg by mouth at bedtime.   Yes Historical Provider, MD    Allergies: No Known Allergies  Social History:  reports that he has never smoked. He has never used smokeless tobacco. He reports that he does not drink alcohol or use drugs.   Family History: Family History  Problem Relation Age of Onset  . Family history unknown: Yes    Review of Systems: Review of Systems  Constitutional: Negative for chills and fever.  HENT: Negative for hearing loss.   Eyes: Negative for blurred vision.  Respiratory: Negative for shortness of breath.   Cardiovascular: Negative for chest pain.  Gastrointestinal: Positive for constipation. Negative for abdominal pain, nausea and vomiting.  Genitourinary: Negative for dysuria and hematuria.  Skin: Negative for rash.  Neurological: Negative for dizziness.  Psychiatric/Behavioral: Negative for depression.    Physical Exam BP (!) 146/55 (BP Location: Left Arm)   Pulse 91   Temp 98 F (36.7 C) (Oral)  Resp 18   Ht 5\' 10"  (1.778 m)   Wt 82.2 kg (181 lb 3.2 oz)   SpO2 94%   BMI 26.00 kg/m  CONSTITUTIONAL: No acute distress, appears comfortable in bed HEENT:  Normocephalic, atraumatic, extraocular motion intact. NECK: Trachea is midline, and there is no jugular venous distension.  RESPIRATORY:  Lungs are clear, and breath sounds are equal bilaterally. Normal respiratory effort without pathologic use of accessory muscles. CARDIOVASCULAR: Heart is regular without murmurs, gallops, or rubs. GI: The  abdomen is soft, nondistended, nontender to palpation. There were no palpable masses. There was no hepatosplenomegaly. RECTAL: Normal rectal tone, with no ulcerations noted, with no tenderness on exam. There are no perianal wounds or abscesses. There are no areas of fluctuance over the buttocks and no areas of tenderness in the buttocks either MUSCULOSKELETAL:  Normal muscle strength and tone in all four extremities.  No peripheral edema or cyanosis. SKIN: There is a stage II sacral decubitus with 2 small blisters on the skin that are draining serous fluid. NEUROLOGIC:  Motor and sensation is grossly normal.  Cranial nerves are grossly intact. PSYCH:  Affect is normal.  Laboratory Analysis: Results for orders placed or performed during the hospital encounter of 05/30/16 (from the past 24 hour(s))  Glucose, capillary     Status: Abnormal   Collection Time: 06/01/16  4:32 PM  Result Value Ref Range   Glucose-Capillary 216 (H) 65 - 99 mg/dL  Glucose, capillary     Status: Abnormal   Collection Time: 06/01/16  9:09 PM  Result Value Ref Range   Glucose-Capillary 150 (H) 65 - 99 mg/dL   Comment 1 Notify RN    Comment 2 Document in Chart   Glucose, capillary     Status: Abnormal   Collection Time: 06/02/16  3:29 AM  Result Value Ref Range   Glucose-Capillary 172 (H) 65 - 99 mg/dL   Comment 1 Notify RN    Comment 2 Document in Chart   CBC     Status: Abnormal   Collection Time: 06/02/16  4:20 AM  Result Value Ref Range   WBC 4.9 3.8 - 10.6 K/uL   RBC 2.93 (L) 4.40 - 5.90 MIL/uL   Hemoglobin 9.4 (L) 13.0 - 18.0 g/dL   HCT 27.6 (L) 40.0 - 52.0 %   MCV 94.2 80.0 - 100.0 fL   MCH 32.0 26.0 - 34.0 pg   MCHC 33.9 32.0 - 36.0 g/dL   RDW 13.4 11.5 - 14.5 %   Platelets 102 (L) 150 - 440 K/uL  Basic metabolic panel     Status: Abnormal   Collection Time: 06/02/16  4:20 AM  Result Value Ref Range   Sodium 142 135 - 145 mmol/L   Potassium 4.2 3.5 - 5.1 mmol/L   Chloride 115 (H) 101 - 111 mmol/L    CO2 22 22 - 32 mmol/L   Glucose, Bld 166 (H) 65 - 99 mg/dL   BUN 30 (H) 6 - 20 mg/dL   Creatinine, Ser 2.06 (H) 0.61 - 1.24 mg/dL   Calcium 7.8 (L) 8.9 - 10.3 mg/dL   GFR calc non Af Amer 27 (L) >60 mL/min   GFR calc Af Amer 31 (L) >60 mL/min   Anion gap 5 5 - 15  Glucose, capillary     Status: Abnormal   Collection Time: 06/02/16  7:32 AM  Result Value Ref Range   Glucose-Capillary 128 (H) 65 - 99 mg/dL   Comment 1 Notify RN  Comment 2 Document in Chart   Glucose, capillary     Status: Abnormal   Collection Time: 06/02/16 11:24 AM  Result Value Ref Range   Glucose-Capillary 187 (H) 65 - 99 mg/dL   Comment 1 Notify RN    Comment 2 Document in Chart     Imaging: Dg Chest 1 View  Result Date: 06/01/2016 CLINICAL DATA:  83 year old with acute onset of fever. EXAM: Portable CHEST 1 VIEW COMPARISON:  CT chest yesterday. Chest x-rays 05/30/2016 and earlier. FINDINGS: Cardiac silhouette upper normal in size to slightly enlarged for AP portable technique, unchanged. Thoracic aorta atherosclerotic. Hilar and mediastinal contours otherwise unremarkable. Lungs clear. Bronchovascular markings normal. Pulmonary vascularity normal. No visible pleural effusions. No pneumothorax. IMPRESSION: No acute cardiopulmonary disease. Electronically Signed   By: Evangeline Dakin M.D.   On: 06/01/2016 18:13    Assessment and Plan: This is a 83 y.o. male admitted with fever and altered mental status.  --On exam there is no evidence of any perianal or perirectal abscesses. The patient does not have any tenderness on my rectal exam. The patient does have a stage II sacral decubitus wound with 2 blisters seeping serous fluid. There is no infection. With the nurse's help we placed a foam dressing over the wound to decrease the amount of pressure over this area. Continue wound care for this. Otherwise there are no surgical needs at this point. --Please free to call or page if any questions or concerns or if we  can be of any assistance in the future.   Melvyn Neth, [place]

## 2016-06-02 NOTE — Progress Notes (Signed)
Foley pulled.

## 2016-06-02 NOTE — Progress Notes (Signed)
Patient ID: Guy Hudson, male   DOB: 13-Oct-1923, 83 y.o.   MRN: [id number]  [employer] [facility] PROGRESS NOTE  Guy Hudson [id number] DOB: 09-04-1923 DOA: 05/30/2016 PCP: Albina Billet, MD  HPI/Subjective: Patient feels okay. Complains of a little right shoulder pain. 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 No cough or shortness of breath. Temperature curve trending better.  Objective: Vitals:   06/01/16 2031 06/02/16 0331  BP: (!) 135/52 (!) 140/55  Pulse: 85 76  Resp: 18 18  Temp: 98.2 F (36.8 C) 97.7 F (36.5 C)    Filed Weights   05/30/16 2316 05/31/16 0625  Weight: 81.6 kg (180 lb) 82.2 kg (181 lb 3.2 oz)    ROS: Review of Systems  Unable to perform ROS: Acuity of condition  Constitutional: Negative for fever.  Respiratory: Negative for cough and shortness of breath.   Cardiovascular: Negative for chest pain.  Gastrointestinal: Negative for abdominal pain, constipation, diarrhea, nausea and vomiting.  Musculoskeletal: Positive for joint pain.   Exam: Physical Exam  HENT:  Nose: No mucosal edema.  Mouth/Throat: No oropharyngeal exudate or posterior oropharyngeal edema.  Eyes: Conjunctivae, EOM and lids are normal. Pupils are equal, round, and reactive to light.  Neck: No JVD present. Carotid bruit is not present. No edema present. No thyroid mass and no thyromegaly present.  Cardiovascular: S1 normal and S2 normal.  Exam reveals no gallop.   No murmur heard. Pulses:      Dorsalis pedis pulses are 2+ on the right side, and 2+ on the left side.  Respiratory: No respiratory distress. He has no wheezes. He has no rhonchi. He has no rales.  GI: Soft. Bowel sounds are normal. There is no tenderness.  Genitourinary:  Genitourinary Comments: Rectal exam shows large prostate and patient tender with palpating the prostate.  Musculoskeletal:       Right ankle: He exhibits swelling.       Left ankle: He exhibits swelling.  Lymphadenopathy:    He has no cervical  adenopathy.  Neurological: He is alert. No cranial nerve deficit.  Skin: Skin is warm. Nails show no clubbing.  Left buttock near the rectal area some area of fluctuance and redness with some bruising over it.  Psychiatric: He has a normal mood and affect.      Data Reviewed: Basic Metabolic Panel:  Recent Labs Lab 05/30/16 2324 05/31/16 0710 06/01/16 0638 06/02/16 0420  NA 140 137 141 142  K 4.8 4.7 4.4 4.2  CL 107 110 113* 115*  CO2 27 22 22 22   GLUCOSE 254* 267* 187* 166*  BUN 40* 37* 34* 30*  CREATININE 2.30* 2.12* 1.98* 2.06*  CALCIUM 8.2* 7.4* 7.6* 7.8*   CBC:  Recent Labs Lab 05/30/16 2324 05/31/16 0710 06/01/16 0638 06/02/16 0420  WBC 8.3 8.7 6.2 4.9  HGB 11.3* 9.8* 9.3* 9.4*  HCT 33.0* 29.7* 27.5* 27.6*  MCV 93.3 94.2 93.6 94.2  PLT 142* 126* 113* 102*   Cardiac Enzymes:  Recent Labs Lab 05/30/16 2324  TROPONINI 0.03*    CBG:  Recent Labs Lab 06/01/16 1133 06/01/16 1632 06/01/16 2109 06/02/16 0329 06/02/16 0732  GLUCAP 217* 216* 150* 172* 128*    Recent Results (from the past 240 hour(s))  Blood Culture (routine x 2)     Status: None (Preliminary result)   Collection Time: 05/30/16 11:24 PM  Result Value Ref Range Status   Specimen Description BLOOD  R [facility]  Final   Special Requests  Final    BOTTLES DRAWN AEROBIC AND ANAEROBIC  AER 13  ANA 12    Culture NO GROWTH 3 DAYS  Final   Report Status PENDING  Incomplete  Blood Culture (routine x 2)     Status: None (Preliminary result)   Collection Time: 05/30/16 11:24 PM  Result Value Ref Range Status   Specimen Description BLOOD  R [facility]   Final   Special Requests   Final    BOTTLES DRAWN AEROBIC AND ANAEROBIC  AER 10 ANA 12 ML   Culture NO GROWTH 3 DAYS  Final   Report Status PENDING  Incomplete  Urine culture     Status: None   Collection Time: 05/30/16 11:25 PM  Result Value Ref Range Status   Specimen Description URINE, RANDOM  Final   Special Requests NONE  Final   Culture NO  GROWTH Performed at [facility]   Final   Report Status 06/01/2016 FINAL  Final  Rapid Influenza A&B Antigens ([place] only)     Status: None   Collection Time: 05/31/16  2:20 AM  Result Value Ref Range Status   Influenza A ([facility]) NEGATIVE NEGATIVE Final   Influenza B ([facility]) NEGATIVE NEGATIVE Final  Culture, blood (x 2)     Status: None (Preliminary result)   Collection Time: 05/31/16  7:10 AM  Result Value Ref Range Status   Specimen Description BLOOD LEFT AC  Final   Special Requests BOTTLES DRAWN AEROBIC AND ANAEROBIC AER7ML ANA7ML  Final   Culture NO GROWTH 2 DAYS  Final   Report Status PENDING  Incomplete  Culture, blood (x 2)     Status: None (Preliminary result)   Collection Time: 05/31/16  8:03 AM  Result Value Ref Range Status   Specimen Description BLOOD LEFT AC  Final   Special Requests   Final    BOTTLES DRAWN AEROBIC AND ANAEROBIC ANA 10ML AER 10ML   Culture NO GROWTH 2 DAYS  Final   Report Status PENDING  Incomplete     Studies: Dg Chest 1 View  Result Date: 06/01/2016 CLINICAL DATA:  83 year old with acute onset of fever. EXAM: Portable CHEST 1 VIEW COMPARISON:  CT chest yesterday. Chest x-rays 05/30/2016 and earlier. FINDINGS: Cardiac silhouette upper normal in size to slightly enlarged for AP portable technique, unchanged. Thoracic aorta atherosclerotic. Hilar and mediastinal contours otherwise unremarkable. Lungs clear. Bronchovascular markings normal. Pulmonary vascularity normal. No visible pleural effusions. No pneumothorax. IMPRESSION: No acute cardiopulmonary disease. Electronically Signed   By: Evangeline Dakin M.D.   On: 06/01/2016 18:13    Scheduled Meds: . atorvastatin  40 mg Oral q1800  . insulin aspart  0-5 Units Subcutaneous QHS  . insulin aspart  0-9 Units Subcutaneous TID WC  . insulin detemir  8 Units Subcutaneous QHS  . sodium chloride flush  3 mL Intravenous Q12H  . terazosin  5 mg Oral QHS   Continuous Infusions: . sodium chloride 40  mL/hr at 06/01/16 1810    Assessment/Plan:  1. Sepsis Secondary to either perirectal abscess versus prostatitis. Patient with fever and tachycardia and acute encephalopathy. Patient placed on vancomycin and Zosyn.  Temperature curve improved. So far cultures are negative. Case discussed with general surgery to evaluate whether fluctuance in the rectal area and needs drainage or not. 2. Acute encephalopathy seems better at this point. 3. Metastatic prostate cancer. Appreciate urology consultation. PSA 98. Bone scan ordered by urology for Monday. 4. Gross hematuria. As per urology can discontinue Foley catheter. Foley  discontinued this morning. Continue gentle IV fluid hydration. As per nursing staff urine was pink this morning. Gross hematuria likely secondary to traumatic placement of Foley catheter 5. Weakness. Physical therapy recommended rehabilitation. Patient states he's going home. 6. Type 2 diabetes mellitus on low-dose detemir insulin and sliding scale 7. Hyperlipidemia unspecified on atorvastatin 8. BPH on Terazosin  Code Status:     Code Status Orders        Start     Ordered   05/31/16 0249  Full code  Continuous     05/31/16 0249    Code Status History    Date Active Date Inactive Code Status Order ID Comments User Context   This patient has a current code status but no historical code status.     Family Communication: son at the bedside Yesterday Disposition Plan: Patient states he's going home but physical therapy recommended rehabilitation  Consultants:  Urology  General surgery  Antibiotics:  Vancomycin and Zosyn  Time spent: 25 minutes  [place], [place]

## 2016-06-02 NOTE — Progress Notes (Signed)
Urology Consult Follow Up  Subjective: Patient seen and examined this morning, more conversant and appropriate. Rectal exam shows an enlarged, nodular prostate occupying the majority of the left side of the prostate. Folate with light pink urine this morning, removed prior to my arrival by nursing staff. Patient has not yet voided.  Anti-infectives: Anti-infectives    Start     Dose/Rate Route Frequency Ordered Stop   06/03/16 1200  vancomycin (VANCOCIN) 1,250 mg in sodium chloride 0.9 % 250 mL IVPB     1,250 mg 166.7 mL/hr over 90 Minutes Intravenous Every 36 hours 06/02/16 1037     06/02/16 1045  piperacillin-tazobactam (ZOSYN) IVPB 3.375 g     3.375 g 12.5 mL/hr over 240 Minutes Intravenous Every 8 hours 06/02/16 1035     06/02/16 0830  cefTRIAXone (ROCEPHIN) 1 g in dextrose 5 % 50 mL IVPB  Status:  Discontinued     1 g 100 mL/hr over 30 Minutes Intravenous Every 24 hours 06/02/16 0733 06/02/16 0805   06/02/16 0000  vancomycin (VANCOCIN) 1,250 mg in sodium chloride 0.9 % 250 mL IVPB  Status:  Discontinued     1,250 mg 166.7 mL/hr over 90 Minutes Intravenous Every 36 hours 06/01/16 1033 06/02/16 0733   05/31/16 1200  vancomycin (VANCOCIN) IVPB 1000 mg/200 mL premix  Status:  Discontinued     1,000 mg 200 mL/hr over 60 Minutes Intravenous Every 36 hours 05/31/16 0626 06/01/16 1033   05/31/16 0800  piperacillin-tazobactam (ZOSYN) IVPB 3.375 g  Status:  Discontinued     3.375 g 12.5 mL/hr over 240 Minutes Intravenous Every 8 hours 05/31/16 0626 06/02/16 0733   05/31/16 0630  piperacillin-tazobactam (ZOSYN) IVPB 3.375 g  Status:  Discontinued     3.375 g 100 mL/hr over 30 Minutes Intravenous  Once 05/31/16 0627 05/31/16 0628   05/31/16 0630  vancomycin (VANCOCIN) IVPB 1000 mg/200 mL premix  Status:  Discontinued     1,000 mg 200 mL/hr over 60 Minutes Intravenous  Once 05/31/16 0627 05/31/16 0628   05/30/16 2330  piperacillin-tazobactam (ZOSYN) IVPB 3.375 g     3.375 g 100 mL/hr over  30 Minutes Intravenous  Once 05/30/16 2329 05/31/16 0006   05/30/16 2330  vancomycin (VANCOCIN) IVPB 1000 mg/200 mL premix     1,000 mg 200 mL/hr over 60 Minutes Intravenous  Once 05/30/16 2329 05/31/16 0146      Current Facility-Administered Medications  Medication Dose Route Frequency Provider Last Rate Last Dose  . 0.9 %  sodium chloride infusion   Intravenous Continuous Loletha Grayer, MD 40 mL/hr at 06/01/16 1810    . acetaminophen (TYLENOL) tablet 650 mg  650 mg Oral Q6H PRN Saundra Shelling, MD   650 mg at 06/01/16 2341   Or  . acetaminophen (TYLENOL) suppository 650 mg  650 mg Rectal Q6H PRN Pavan Pyreddy, MD      . atorvastatin (LIPITOR) tablet 40 mg  40 mg Oral q1800 Pavan Pyreddy, MD   40 mg at 06/01/16 1715  . insulin aspart (novoLOG) injection 0-5 Units  0-5 Units Subcutaneous QHS Henreitta Leber, MD   2 Units at 05/31/16 2310  . insulin aspart (novoLOG) injection 0-9 Units  0-9 Units Subcutaneous TID WC Henreitta Leber, MD   1 Units at 06/02/16 0900  . insulin detemir (LEVEMIR) injection 8 Units  8 Units Subcutaneous QHS Henreitta Leber, MD   8 Units at 06/01/16 2341  . ondansetron (ZOFRAN) tablet 4 mg  4 mg Oral Q6H PRN  Saundra Shelling, MD       Or  . ondansetron (ZOFRAN) injection 4 mg  4 mg Intravenous Q6H PRN Pavan Pyreddy, MD      . piperacillin-tazobactam (ZOSYN) IVPB 3.375 g  3.375 g Intravenous Q8H Richard Leslye Peer, MD      . senna-docusate (Senokot-S) tablet 1 tablet  1 tablet Oral QHS PRN Saundra Shelling, MD      . sodium chloride flush (NS) 0.9 % injection 3 mL  3 mL Intravenous Q12H Saundra Shelling, MD   3 mL at 06/01/16 2356  . terazosin (HYTRIN) capsule 5 mg  5 mg Oral QHS Saundra Shelling, MD   5 mg at 06/01/16 2341  . [START ON 06/03/2016] vancomycin (VANCOCIN) 1,250 mg in sodium chloride 0.9 % 250 mL IVPB  1,250 mg Intravenous Q36H Loletha Grayer, MD         Objective: Vital signs in last 24 hours: Temp:  [97.7 F (36.5 C)-98.2 F (36.8 C)] 98 F (36.7 C) (01/07  1123) Pulse Rate:  [76-91] 91 (01/07 1123) Resp:  [16-18] 18 (01/07 1123) BP: (135-168)/(52-55) 146/55 (01/07 1123) SpO2:  [94 %-98 %] 94 % (01/07 1123)  Intake/Output from previous day: 01/06 0701 - 01/07 0700 In: 1280 [P.O.:360; I.V.:320; IV Piggyback:300] Out: 1950 [Urine:1950] Intake/Output this shift: Total I/O In: 240 [P.O.:240] Out: 150 [Urine:150]   Physical Exam  Constitutional: Conversive and alert this morning. Answering questions appropriately. HEENT: [place] AT, moist mucus membranes.  Trachea midline, no masses Cardiovascular: Regular rate and rhythm, no clubbing, cyanosis, or edema. Respiratory: Normal respiratory effort, no respiratory distress. GI: Abdomen is soft, nontender, nondistended, no abdominal masses Rectal exam: Normal sphincter tone.  enlarged, 50+ cc prostate with large firm nodular component occupying the majority of the left side of the prostate but also small nodule in the right. Skin: ulceration with bullous skin changes overlying sacrum appreciated today, surrounding erythema.  Neurologic: Grossly intact, no focal deficits, moving all 4 extremities Psychiatric: Normal mood and affect  Lab Results:   Recent Labs  06/01/16 0638 06/02/16 0420  WBC 6.2 4.9  HGB 9.3* 9.4*  HCT 27.5* 27.6*  PLT 113* 102*   BMET  Recent Labs  06/01/16 0638 06/02/16 0420  NA 141 142  K 4.4 4.2  CL 113* 115*  CO2 22 22  GLUCOSE 187* 166*  BUN 34* 30*  CREATININE 1.98* 2.06*  CALCIUM 7.6* 7.8*   PT/INR No results for input(s): LABPROT, INR in the last 72 hours. ABG  Recent Labs  05/30/16 2344  HCO3 27.4     Studies/Results: Dg Chest 1 View  Result Date: 06/01/2016 CLINICAL DATA:  83 year old with acute onset of fever. EXAM: Portable CHEST 1 VIEW COMPARISON:  CT chest yesterday. Chest x-rays 05/30/2016 and earlier. FINDINGS: Cardiac silhouette upper normal in size to slightly enlarged for AP portable technique, unchanged. Thoracic aorta  atherosclerotic. Hilar and mediastinal contours otherwise unremarkable. Lungs clear. Bronchovascular markings normal. Pulmonary vascularity normal. No visible pleural effusions. No pneumothorax. IMPRESSION: No acute cardiopulmonary disease. Electronically Signed   By: Evangeline Dakin M.D.   On: 06/01/2016 18:13   Component     Latest Ref Rng & Units 05/31/2016 06/01/2016  PSA     0.00 - 4.00 ng/mL 91.36 (H) 72.49 (H)    Impression/Assessment:  83 year old male admitted with altered mental status presumed secondary to infectious etiology, although source has not yet been identified, improving. Urology consult at for gross hematuria, possible history of prostate cancer.  Plan:  #1. Prostate  cancer (clinically dx)- Prostate exam today, markedly elevated PSA consistent with these findings, likely T4, stage IV with boney mets.  CT scan findings including sclerotic bone disease and what appears to be a locally invasive gland into the bladder neck, -Bone can Monday -He would likely benefit from androgen deprivation therapy/ denosumab Pablo Lawrence), will arrange for prompt outpatient follow-up to initiate this  #2. Gross hematuria- likely traumatic secondary to Foley catheter placement with a friable/enlarged prostate.  Urine light pink this AM, foley removed.  Hgb stable -Replace Foley if unable to void  #3. CKD- creatinine appears to be at baseline. No hydroureteronephrosis or evidence of obstruction.   Hollice Espy, MD     LOS: 2 days

## 2016-06-03 ENCOUNTER — Inpatient Hospital Stay: Payer: Medicare Other

## 2016-06-03 DIAGNOSIS — R509 Fever, unspecified: Secondary | ICD-10-CM

## 2016-06-03 DIAGNOSIS — A419 Sepsis, unspecified organism: Principal | ICD-10-CM

## 2016-06-03 DIAGNOSIS — M549 Dorsalgia, unspecified: Secondary | ICD-10-CM

## 2016-06-03 DIAGNOSIS — R2681 Unsteadiness on feet: Secondary | ICD-10-CM

## 2016-06-03 DIAGNOSIS — Z7189 Other specified counseling: Secondary | ICD-10-CM

## 2016-06-03 DIAGNOSIS — Z66 Do not resuscitate: Secondary | ICD-10-CM

## 2016-06-03 LAB — GLUCOSE, CAPILLARY
GLUCOSE-CAPILLARY: 179 mg/dL — AB (ref 65–99)
GLUCOSE-CAPILLARY: 193 mg/dL — AB (ref 65–99)
Glucose-Capillary: 296 mg/dL — ABNORMAL HIGH (ref 65–99)
Glucose-Capillary: 225 mg/dL — ABNORMAL HIGH (ref 65–99)

## 2016-06-03 MED ORDER — METOPROLOL TARTRATE 25 MG PO TABS
25.0000 mg | ORAL_TABLET | Freq: Two times a day (BID) | ORAL | Status: DC
Start: 2016-06-03 — End: 2016-06-04
  Administered 2016-06-03 – 2016-06-04 (×2): 25 mg via ORAL
  Filled 2016-06-03 (×3): qty 1

## 2016-06-03 MED ORDER — METOPROLOL TARTRATE 5 MG/5ML IV SOLN
5.0000 mg | INTRAVENOUS | Status: DC | PRN
  Administered 2016-06-03: 5 mg via INTRAVENOUS
  Filled 2016-06-03: qty 5

## 2016-06-03 MED ORDER — LORAZEPAM 2 MG/ML IJ SOLN
1.0000 mg | Freq: Four times a day (QID) | INTRAMUSCULAR | Status: DC | PRN
  Administered 2016-06-03 (×3): 1 mg via INTRAVENOUS
  Filled 2016-06-03 (×3): qty 1

## 2016-06-03 MED ORDER — TECHNETIUM TC 99M MEDRONATE IV KIT
25.0000 | PACK | Freq: Once | INTRAVENOUS | Status: AC | PRN
Start: 2016-06-03 — End: 2016-06-03
  Administered 2016-06-03: 23.05 via INTRAVENOUS

## 2016-06-03 NOTE — Care Management Important Message (Signed)
Important Message  Patient Details  Name: Guy Hudson MRN: [id number] Date of Birth: Jul 30, 1923   Medicare Important Message Given:  Yes signed IM printed from Epic and given to patient.    Katrina Stack, RN 06/03/2016, 9:37 AM

## 2016-06-03 NOTE — Progress Notes (Signed)
Urology Consult Follow Up  06/03/16   Subjective: Bone scan today with extensive, diffuse bony metastatic disease consistent with stage IV metastatic prostate cancer. Patient is more lethargic today, confused, and overall doing poorly. Seen today by palliative care. Of note, son does report that his dad mention that his legs were "working right" recently.  Anti-infectives: Anti-infectives    Start     Dose/Rate Route Frequency Ordered Stop   06/03/16 1200  vancomycin (VANCOCIN) 1,250 mg in sodium chloride 0.9 % 250 mL IVPB  Status:  Discontinued     1,250 mg 166.7 mL/hr over 90 Minutes Intravenous Every 36 hours 06/02/16 1037 06/02/16 1447   06/02/16 1800  cefTRIAXone (ROCEPHIN) IVPB 1 g  Status:  Discontinued     1 g 100 mL/hr over 30 Minutes Intravenous Every 24 hours 06/02/16 1451 06/03/16 0655   06/02/16 1500  cefTRIAXone (ROCEPHIN) 1 g in dextrose 5 % 50 mL IVPB  Status:  Discontinued     1 g 100 mL/hr over 30 Minutes Intravenous Every 24 hours 06/02/16 1447 06/02/16 1451   06/02/16 1045  piperacillin-tazobactam (ZOSYN) IVPB 3.375 g  Status:  Discontinued     3.375 g 12.5 mL/hr over 240 Minutes Intravenous Every 8 hours 06/02/16 1035 06/02/16 1447   06/02/16 0830  cefTRIAXone (ROCEPHIN) 1 g in dextrose 5 % 50 mL IVPB  Status:  Discontinued     1 g 100 mL/hr over 30 Minutes Intravenous Every 24 hours 06/02/16 0733 06/02/16 0805   06/02/16 0000  vancomycin (VANCOCIN) 1,250 mg in sodium chloride 0.9 % 250 mL IVPB  Status:  Discontinued     1,250 mg 166.7 mL/hr over 90 Minutes Intravenous Every 36 hours 06/01/16 1033 06/02/16 0733   05/31/16 1200  vancomycin (VANCOCIN) IVPB 1000 mg/200 mL premix  Status:  Discontinued     1,000 mg 200 mL/hr over 60 Minutes Intravenous Every 36 hours 05/31/16 0626 06/01/16 1033   05/31/16 0800  piperacillin-tazobactam (ZOSYN) IVPB 3.375 g  Status:  Discontinued     3.375 g 12.5 mL/hr over 240 Minutes Intravenous Every 8 hours 05/31/16 0626 06/02/16  0733   05/31/16 0630  piperacillin-tazobactam (ZOSYN) IVPB 3.375 g  Status:  Discontinued     3.375 g 100 mL/hr over 30 Minutes Intravenous  Once 05/31/16 0627 05/31/16 0628   05/31/16 0630  vancomycin (VANCOCIN) IVPB 1000 mg/200 mL premix  Status:  Discontinued     1,000 mg 200 mL/hr over 60 Minutes Intravenous  Once 05/31/16 0627 05/31/16 0628   05/30/16 2330  piperacillin-tazobactam (ZOSYN) IVPB 3.375 g     3.375 g 100 mL/hr over 30 Minutes Intravenous  Once 05/30/16 2329 05/31/16 0006   05/30/16 2330  vancomycin (VANCOCIN) IVPB 1000 mg/200 mL premix     1,000 mg 200 mL/hr over 60 Minutes Intravenous  Once 05/30/16 2329 05/31/16 0146      Current Facility-Administered Medications  Medication Dose Route Frequency Provider Last Rate Last Dose  . 0.9 %  sodium chloride infusion   Intravenous Continuous Loletha Grayer, MD 40 mL/hr at 06/01/16 1810    . acetaminophen (TYLENOL) tablet 650 mg  650 mg Oral Q6H PRN Saundra Shelling, MD   650 mg at 06/03/16 0949   Or  . acetaminophen (TYLENOL) suppository 650 mg  650 mg Rectal Q6H PRN Pavan Pyreddy, MD      . atorvastatin (LIPITOR) tablet 40 mg  40 mg Oral q1800 Pavan Pyreddy, MD   40 mg at 06/02/16 1701  . insulin  aspart (novoLOG) injection 0-5 Units  0-5 Units Subcutaneous QHS Henreitta Leber, MD   3 Units at 06/02/16 2148  . insulin aspart (novoLOG) injection 0-9 Units  0-9 Units Subcutaneous TID WC Henreitta Leber, MD   2 Units at 06/03/16 0849  . insulin detemir (LEVEMIR) injection 8 Units  8 Units Subcutaneous QHS Henreitta Leber, MD   8 Units at 06/02/16 2148  . LORazepam (ATIVAN) injection 1 mg  1 mg Intravenous Q6H PRN Max Sane, MD   1 mg at 06/03/16 1533  . metoprolol (LOPRESSOR) injection 5 mg  5 mg Intravenous Q4H PRN Saundra Shelling, MD   5 mg at 06/03/16 0431  . metoprolol tartrate (LOPRESSOR) tablet 25 mg  25 mg Oral BID Max Sane, MD   25 mg at 06/03/16 0800  . ondansetron (ZOFRAN) tablet 4 mg  4 mg Oral Q6H PRN Saundra Shelling, MD        Or  . ondansetron (ZOFRAN) injection 4 mg  4 mg Intravenous Q6H PRN Pavan Pyreddy, MD      . polyethylene glycol (MIRALAX / GLYCOLAX) packet 17 g  17 g Oral Daily Loletha Grayer, MD   17 g at 06/03/16 0949  . senna-docusate (Senokot-S) tablet 1 tablet  1 tablet Oral QHS PRN Saundra Shelling, MD   1 tablet at 06/03/16 0950  . sodium chloride flush (NS) 0.9 % injection 3 mL  3 mL Intravenous Q12H Saundra Shelling, MD   3 mL at 06/03/16 0958  . terazosin (HYTRIN) capsule 5 mg  5 mg Oral QHS Saundra Shelling, MD   5 mg at 06/02/16 2148     Objective: Vital signs in last 24 hours: Temp:  [97.7 F (36.5 C)-98.4 F (36.9 C)] 98.2 F (36.8 C) (01/08 1200) Pulse Rate:  [87-122] 87 (01/08 1200) Resp:  [16-18] 18 (01/08 1200) BP: (115-169)/(38-84) 115/59 (01/08 1200) SpO2:  [92 %-100 %] 99 % (01/08 1200)  Intake/Output from previous day: 01/07 0701 - 01/08 0700 In: 1528 [P.O.:460; I.V.:1068] Out: 1775 [Urine:1775] Intake/Output this shift: Total I/O In: -  Out: 400 [Urine:400]   Physical Exam  Constitutional: Lethargic, not easily arousable this evening. No family at bedside. HEENT: [place] AT, moist mucus membranes.  Trachea midline, no masses Cardiovascular: Regular rate and rhythm, no clubbing, cyanosis, or edema. Respiratory: Normal respiratory effort, no respiratory distress. GI: Abdomen is soft, nontender, nondistended, no abdominal masses Neurologic: Grossly intact, no focal deficits, moving all 4 extremities Psychiatric: Normal mood and affect  Lab Results:   Recent Labs  06/01/16 0638 06/02/16 0420  WBC 6.2 4.9  HGB 9.3* 9.4*  HCT 27.5* 27.6*  PLT 113* 102*   BMET  Recent Labs  06/01/16 0638 06/02/16 0420  NA 141 142  K 4.4 4.2  CL 113* 115*  CO2 22 22  GLUCOSE 187* 166*  BUN 34* 30*  CREATININE 1.98* 2.06*  CALCIUM 7.6* 7.8*   PT/INR No results for input(s): LABPROT, INR in the last 72 hours. ABG No results for input(s): PHART, HCO3 in the last 72  hours.  Invalid input(s): PCO2, PO2   Studies/Results: Dg Chest 1 View  Result Date: 06/01/2016 CLINICAL DATA:  83 year old with acute onset of fever. EXAM: Portable CHEST 1 VIEW COMPARISON:  CT chest yesterday. Chest x-rays 05/30/2016 and earlier. FINDINGS: Cardiac silhouette upper normal in size to slightly enlarged for AP portable technique, unchanged. Thoracic aorta atherosclerotic. Hilar and mediastinal contours otherwise unremarkable. Lungs clear. Bronchovascular markings normal. Pulmonary vascularity normal. No visible  pleural effusions. No pneumothorax. IMPRESSION: No acute cardiopulmonary disease. Electronically Signed   By: Evangeline Dakin M.D.   On: 06/01/2016 18:13   Nm Bone Scan Whole Body  Result Date: 06/03/2016 CLINICAL DATA:  Prostate cancer EXAM: NUCLEAR MEDICINE WHOLE BODY BONE SCAN TECHNIQUE: Whole body anterior and posterior images were obtained approximately 3 hours after intravenous injection of radiopharmaceutical. RADIOPHARMACEUTICALS:  23.1 mCi Technetium-20m MDP IV COMPARISON:  CT 05/31/2016 FINDINGS: Innumerable bony metastases noted throughout the lower cervical, thoracic and lumbar spine, multiple bilateral ribs, sternum, pelvis, shoulders, humeri, and bilateral proximal femurs. Possible skullbase metastasis. IMPRESSION: Extensive bony metastatic disease. Electronically Signed   By: Rolm Baptise M.D.   On: 06/03/2016 11:48   Component     Latest Ref Rng & Units 05/31/2016 06/01/2016  PSA     0.00 - 4.00 ng/mL 91.36 (H) 72.49 (H)   Bone scan personally reviewed today.  Impression/Assessment:  83 year old male admitted with altered mental status presumed secondary to infectious etiology, although source has not been identified. Urology consult at for gross hematuria, metastatic prostate cancer.    Plan:  #1. Prostate cancer (clinically dx)- Prostate exam today, markedly elevated PSA consistent with these findings, likely T4, stage IV with boney mets.  CT scan findings  including sclerotic bone disease and what appears to be a locally invasive gland into the bladder neck.  Bone scan today with diffuse bony metastatic disease. -Per report, the patient has had some leg weakness and difficulty with ambulation. As such, I would recommend spinal MRI to rule out cord compression in the setting of diffuse metastatic disease. -He would likely benefit from androgen deprivation therapy/ denosumab Pablo Lawrence), will arrange for prompt outpatient follow-up to initiate this if desired. We'll discuss this further with the family at bedside.  Also considering palliative care.    #2Johney Maine hematuria- likely traumatic secondary to Foley catheter placement with a friable/enlarged prostate. Appears to be voiding well since catheter was removed.  #3. CKD- creatinine appears to be at baseline. No hydroureteronephrosis or evidence of obstruction.   Patient was discussed earlier today with Dr. Manuella Ghazi.  Hollice Espy, MD     LOS: 3 days

## 2016-06-03 NOTE — Progress Notes (Addendum)
Patient ID: Guy Hudson, male   DOB: 03/27/1924, 83 y.o.   MRN: [id number]  [facility] PROGRESS NOTE  Guy Hudson [id number] DOB: 01/22/1924 DOA: 05/30/2016 PCP: Albina Billet, MD  HPI/Subjective: No new complaints, wants to go home, heart rate up to 120. He seems confused  Objective: Vitals:   06/02/16 2017 06/03/16 0435  BP: (!) 169/74 (!) 131/38  Pulse: (!) 102 (!) 122  Resp: 18 16  Temp: 98.2 F (36.8 C) 97.7 F (36.5 C)    Filed Weights   05/30/16 2316 05/31/16 0625  Weight: 81.6 kg (180 lb) 82.2 kg (181 lb 3.2 oz)    ROS: Review of Systems  Constitutional: Negative for chills, fever and weight loss.  HENT: Negative for nosebleeds and sore throat.   Eyes: Negative for blurred vision.  Respiratory: Negative for cough, shortness of breath and wheezing.   Cardiovascular: Negative for chest pain, orthopnea, leg swelling and PND.  Gastrointestinal: Negative for abdominal pain, constipation, diarrhea, heartburn, nausea and vomiting.  Genitourinary: Negative for dysuria and urgency.  Musculoskeletal: Positive for joint pain. Negative for back pain.  Skin: Negative for rash.  Neurological: Negative for dizziness, speech change, focal weakness and headaches.  Endo/Heme/Allergies: Does not bruise/bleed easily.  Psychiatric/Behavioral: Negative for depression.   Exam: Physical Exam  HENT:  Nose: No mucosal edema.  Mouth/Throat: No oropharyngeal exudate or posterior oropharyngeal edema.  Eyes: Conjunctivae, EOM and lids are normal. Pupils are equal, round, and reactive to light.  Neck: No JVD present. Carotid bruit is not present. No edema present. No thyroid mass and no thyromegaly present.  Cardiovascular: S1 normal and S2 normal.  Exam reveals no gallop.   No murmur heard. Pulses:      Dorsalis pedis pulses are 2+ on the right side, and 2+ on the left side.  Respiratory: No respiratory distress. He has no wheezes. He has no rhonchi. He has no rales.  GI:  Soft. Bowel sounds are normal. There is no tenderness.  Musculoskeletal:       Right ankle: He exhibits no swelling.       Left ankle: He exhibits no swelling.  Lymphadenopathy:    He has no cervical adenopathy.  Neurological: He is alert. He is disoriented. No cranial nerve deficit.  Pleasantly confused  Skin: Skin is warm. Nails show no clubbing.  Left buttock near the rectal area some area of fluctuance and redness with some bruising over it.  Psychiatric: He has a normal mood and affect.     Data Reviewed: Basic Metabolic Panel:  Recent Labs Lab 05/30/16 2324 05/31/16 0710 06/01/16 0638 06/02/16 0420  NA 140 137 141 142  K 4.8 4.7 4.4 4.2  CL 107 110 113* 115*  CO2 27 22 22 22   GLUCOSE 254* 267* 187* 166*  BUN 40* 37* 34* 30*  CREATININE 2.30* 2.12* 1.98* 2.06*  CALCIUM 8.2* 7.4* 7.6* 7.8*   CBC:  Recent Labs Lab 05/30/16 2324 05/31/16 0710 06/01/16 0638 06/02/16 0420  WBC 8.3 8.7 6.2 4.9  HGB 11.3* 9.8* 9.3* 9.4*  HCT 33.0* 29.7* 27.5* 27.6*  MCV 93.3 94.2 93.6 94.2  PLT 142* 126* 113* 102*   Cardiac Enzymes:  Recent Labs Lab 05/30/16 2324  TROPONINI 0.03*    CBG:  Recent Labs Lab 06/01/16 2109 06/02/16 0329 06/02/16 0732 06/02/16 1124 06/02/16 2139  GLUCAP 150* 172* 128* 187* 275*    Recent Results (from the past 240 hour(s))  Blood Culture (routine x 2)  Status: None (Preliminary result)   Collection Time: 05/30/16 11:24 PM  Result Value Ref Range Status   Specimen Description BLOOD  R AC  Final   Special Requests   Final    BOTTLES DRAWN AEROBIC AND ANAEROBIC  AER 13  ANA 12    Culture NO GROWTH 3 DAYS  Final   Report Status PENDING  Incomplete  Blood Culture (routine x 2)     Status: None (Preliminary result)   Collection Time: 05/30/16 11:24 PM  Result Value Ref Range Status   Specimen Description BLOOD  R WRIST   Final   Special Requests   Final    BOTTLES DRAWN AEROBIC AND ANAEROBIC  AER 10 ANA 12 ML   Culture NO GROWTH 3  DAYS  Final   Report Status PENDING  Incomplete  Urine culture     Status: None   Collection Time: 05/30/16 11:25 PM  Result Value Ref Range Status   Specimen Description URINE, RANDOM  Final   Special Requests NONE  Final   Culture NO GROWTH Performed at [facility]   Final   Report Status 06/01/2016 FINAL  Final  Rapid Influenza A&B Antigens ([place] only)     Status: None   Collection Time: 05/31/16  2:20 AM  Result Value Ref Range Status   Influenza A ([facility]) NEGATIVE NEGATIVE Final   Influenza B ([facility]) NEGATIVE NEGATIVE Final  Culture, blood (x 2)     Status: None (Preliminary result)   Collection Time: 05/31/16  7:10 AM  Result Value Ref Range Status   Specimen Description BLOOD LEFT AC  Final   Special Requests BOTTLES DRAWN AEROBIC AND ANAEROBIC AER7ML ANA7ML  Final   Culture NO GROWTH 2 DAYS  Final   Report Status PENDING  Incomplete  Culture, blood (x 2)     Status: None (Preliminary result)   Collection Time: 05/31/16  8:03 AM  Result Value Ref Range Status   Specimen Description BLOOD LEFT AC  Final   Special Requests   Final    BOTTLES DRAWN AEROBIC AND ANAEROBIC ANA 10ML AER 10ML   Culture NO GROWTH 2 DAYS  Final   Report Status PENDING  Incomplete     Studies: Dg Chest 1 View  Result Date: 06/01/2016 CLINICAL DATA:  83 year old with acute onset of fever. EXAM: Portable CHEST 1 VIEW COMPARISON:  CT chest yesterday. Chest x-rays 05/30/2016 and earlier. FINDINGS: Cardiac silhouette upper normal in size to slightly enlarged for AP portable technique, unchanged. Thoracic aorta atherosclerotic. Hilar and mediastinal contours otherwise unremarkable. Lungs clear. Bronchovascular markings normal. Pulmonary vascularity normal. No visible pleural effusions. No pneumothorax. IMPRESSION: No acute cardiopulmonary disease. Electronically Signed   By: Evangeline Dakin M.D.   On: 06/01/2016 18:13    Scheduled Meds: . atorvastatin  40 mg Oral q1800  . insulin aspart  0-5  Units Subcutaneous QHS  . insulin aspart  0-9 Units Subcutaneous TID WC  . insulin detemir  8 Units Subcutaneous QHS  . metoprolol tartrate  25 mg Oral BID  . polyethylene glycol  17 g Oral Daily  . sodium chloride flush  3 mL Intravenous Q12H  . terazosin  5 mg Oral QHS   Continuous Infusions: . sodium chloride 40 mL/hr at 06/01/16 1810    Assessment/Plan:  1. Sepsis: Present on admission Secondary to prostatitis/viral infection.  Stop vancomycin and Zosyn. Temperature curve improved.  No leukocytosis. So far cultures are negative.  Appreciate General surgery input, no signs of infection  or abscess 2. Sinus tachycardia/hypertension: We will add metoprolol for better heart rate and blood pressure control 3. Acute metabolic encephalopathy: Likely due to sepsis Back to baseline 4. Metastatic prostate cancer: Appreciate urology consultation. PSA 91-> 72. Bone scan ordered by urology for Today. 5. Gross hematuria. Gross hematuria likely secondary to traumatic placement of Foley catheter.  Now resolved 6. Weakness. Physical therapy recommended rehabilitation. Patient prefers going home.  He is refusing rehabilitation as his wife is also at home and he is taking care of her 7. Type 2 diabetes mellitus on low-dose detemir insulin and sliding scale 8. Hyperlipidemia unspecified on atorvastatin 9. BPH on Terazosin    Code Status: Full code, Palliative care consultation    Code Status Orders        Start     Ordered   05/31/16 0249  Full code  Continuous     05/31/16 0249    Code Status History    Date Active Date Inactive Code Status Order ID Comments User Context   This patient has a current code status but no historical code status.     Family Communication: son via phone Disposition Plan: Patient states he's going home but physical therapy recommended rehabilitation  Consultants:  Urology  General surgery  Antibiotics:  stopped  Time spent: 25 minutes  Vondra Aldredge  [employer]

## 2016-06-03 NOTE — Progress Notes (Signed)
Please note MRI L spine ordered STAT this afternoon @ 1434 to r/o spinal cord compression. Not done yet for some reason.

## 2016-06-03 NOTE — Progress Notes (Addendum)
Family Meeting Note  Advance Directive: NO  Today a meeting took place with the Patient.  I did discuss with patient's son on the phone.  He is agreeable with palliative care discussion.  The following clinical team members were present during this meeting:MD  The following were discussed:Patient's diagnosis: , Patient's progosis: < 12 months and Goals for treatment: Full Code  Additional follow-up to be provided: Palliative Care consultation  Time spent during discussion:20 minutes  Max Sane, MD

## 2016-06-03 NOTE — Care Management (Signed)
Admitted from with temp of 103 and altered mental status due to sepsis.  Etiology of sepsis not identified yet.  Recently diagnosed with prostate cancer. Was independent in his adls  but is confused at present. No issues identified with accessing medical care, obtaining meds or transportation to appointments.  Physical therapy has recommended skilled nursing placement. Palliative care is consulting and recommends palliative to follow at skilled nursing. Patient had informed his wife that he did not want to pursue treatment for his prostate cancer.  He is a DNR

## 2016-06-03 NOTE — Consult Note (Signed)
Consultation Note Date: 06/03/2016   Patient Name: Guy Hudson  DOB: Oct 11, 1923  MRN: [id number]  Age / Sex: 83 y.o., male  PCP: Albina Billet, MD Referring Physician: Max Sane, MD  Reason for Consultation: Establishing goals of care  HPI/Patient Profile: 83 y.o. male  with past medical history of HTN, DM2, elevated PSA admitted on 05/30/2016 with fever and altered mental status. Workup revealed sepsis.  Patient has history of elevated PSA, but has not been worked up further, he has been told it is likely prostate cancer, but no treatment or workup was planned. CT scan reveals bone lesions that could possibly be bone metastasis. Urology has been consulted and offered androgen deprivation therapy along with palliative bisphosphonate tx for bone mets.   Clinical Assessment and Goals of Care: Met with patient's son, Guy Hudson. Patient was not oriented to place or situation and therefore unable to participate in [place] conversation, however, he did state that he would prefer Guy Hudson to be the decision maker for his healthcare. Patient's wife has Parkinson's. Patient lives with wife and son lives in [place] and visits weekly to clean their house and buy their groceries. Patient recently told his son that "his legs weren't working right", and patient agreed to stop driving, but otherwise has been independent at home with ADL's and living with high level of functioning.  Guy Hudson notes that his mother told him that his father had prostate cancer just before Christmas. States he had elected for no further workup or treatment due to his advanced age. Rather, had planned to treat symptomatically per primary MD. Guy Hudson states his father would not want long term ventilation. After further discussion, Guy Hudson decided that CPR and intubation would not meet his father's goals of care. Guy Hudson expressed that in patient's current state he would not be able  to return home. I noted that PT had recommended SNF for rehab at [place] and Guy Hudson agrees this is best. I also recommended palliative f/u at SNF and he agreed to this as well. Patient is scheduled for bone scan today. Guy Hudson would like to proceed with that to determine how advanced cancer is before determining if further tx for prostate ca.   Primary Decision Maker NEXT OF KIN- son- Guy Hudson    SUMMARY OF RECOMMENDATIONS -DNR -D/C to SNF with palliative services -Palliative will f/u tomorrow after bone scan results    Code Status/Advance Care Planning:  DNR    Symptom Management:   Patient with no complaints at this time  Palliative Prophylaxis:   Delirium Protocol   Prognosis:    Unable to determine  Discharge Planning: [place] for rehab with Palliative care service follow-up  Primary Diagnoses: Present on Admission: . Sepsis ([place])   I have reviewed the medical record, interviewed the patient and family, and examined the patient. The following aspects are pertinent.  Past Medical History:  Diagnosis Date  . Diabetes mellitus without complication ([place])   . Hypertension    Social History   Social History  .  Marital status: Married    Spouse name: N/A  . Number of children: N/A  . Years of education: N/A   Occupational History  . retired    Social History Main Topics  . Smoking status: Never Smoker  . Smokeless tobacco: Never Used  . Alcohol use No  . Drug use: No  . Sexual activity: Not Asked   Other Topics Concern  . None   Social History Narrative  . None   Family History  Problem Relation Age of Onset  . Family history unknown: Yes   Scheduled Meds: . atorvastatin  40 mg Oral q1800  . insulin aspart  0-5 Units Subcutaneous QHS  . insulin aspart  0-9 Units Subcutaneous TID WC  . insulin detemir  8 Units Subcutaneous QHS  . metoprolol tartrate  25 mg Oral BID  . polyethylene glycol  17 g Oral Daily  . sodium chloride flush  3 mL  Intravenous Q12H  . terazosin  5 mg Oral QHS   Continuous Infusions: . sodium chloride 40 mL/hr at 06/01/16 1810   PRN Meds:.acetaminophen **OR** acetaminophen, LORazepam, metoprolol, ondansetron **OR** ondansetron (ZOFRAN) IV, senna-docusate Medications Prior to Admission:  Prior to Admission medications   Medication Sig Start Date End Date Taking? Authorizing Provider  acetaminophen (TYLENOL) 500 MG tablet Take 500-1,000 mg by mouth every 6 (six) hours as needed for mild pain or headache.   Yes Historical Provider, MD  aspirin EC 81 MG tablet Take 162 mg by mouth daily.    Yes Historical Provider, MD  ferrous sulfate 325 (65 FE) MG tablet Take 325 mg by mouth 3 (three) times a week. Pt takes on Monday, Wednesday, and Friday.   Yes Historical Provider, MD  furosemide (LASIX) 40 MG tablet Take 40 mg by mouth daily. Pt does not take on Monday, Wednesday, and Friday.   Yes Historical Provider, MD  insulin aspart (NOVOLOG) 100 UNIT/ML injection Inject 8 Units into the skin 2 (two) times daily.   Yes Historical Provider, MD  insulin detemir (LEVEMIR) 100 UNIT/ML injection Inject 36 Units into the skin 2 (two) times daily.    Yes Historical Provider, MD  metoprolol (LOPRESSOR) 50 MG tablet Take 50 mg by mouth 2 (two) times daily.   Yes Historical Provider, MD  polyethylene glycol (MIRALAX) packet Take 17 g by mouth daily. 05/12/16  Yes Daymon Larsen, MD  simvastatin (ZOCOR) 80 MG tablet Take 40 mg by mouth at bedtime.   Yes Historical Provider, MD  terazosin (HYTRIN) 5 MG capsule Take 5 mg by mouth at bedtime.   Yes Historical Provider, MD   No Known Allergies Review of Systems  Unable to perform ROS: Mental status change    Physical Exam  Constitutional: He appears well-developed and well-nourished. No distress.  Cardiovascular: Intact distal pulses.   tahcycardic  Pulmonary/Chest: Effort normal. No respiratory distress.  Abdominal: Soft. Bowel sounds are normal.  Musculoskeletal:    Generalized weakness  Neurological:  Not oriented to place or situation  Skin: Skin is warm and dry.  Psychiatric: He has a normal mood and affect. His behavior is normal.  Nursing note and vitals reviewed.   Vital Signs: BP (!) 162/84 (BP Location: Right Arm)   Pulse (!) 105   Temp 98.4 F (36.9 C) (Oral)   Resp 18   Ht _0  (1.778 m)   Wt 82.2 kg (181 lb 3.2 oz)   SpO2 100%   BMI 26.00 kg/m  Pain Assessment: No/denies pain  Pain Score: Asleep   SpO2: SpO2: 100 % O2 Device:SpO2: 100 % O2 Flow Rate: .O2 Flow Rate (L/min): 1 L/min  IO: Intake/output summary:  Intake/Output Summary (Last 24 hours) at 06/03/16 1004 Last data filed at 06/03/16 0800  Gross per 24 hour  Intake             1168 ml  Output             1825 ml  Net             -657 ml    LBM: Last BM Date: 06/02/16 Baseline Weight: Weight: 81.6 kg (180 lb) Most recent weight: Weight: 82.2 kg (181 lb 3.2 oz)     Palliative Assessment/Data: PPS: 50%     Thank you for this consult. Palliative medicine will continue to follow and assist as needed.   Time In: 0915 Time Out: 1015 Time Total: 60 mintues Greater than 50%  of this time was spent counseling and coordinating care related to the above assessment and plan.  Signed by: Mariana Kaufman, AGNP-C Palliative Medicine    Please contact Palliative Medicine Team phone at [phone] for questions and concerns.  For individual provider: See Shea Evans

## 2016-06-04 ENCOUNTER — Encounter: Payer: Self-pay | Admitting: Family Medicine

## 2016-06-04 LAB — CULTURE, BLOOD (ROUTINE X 2)
CULTURE: NO GROWTH
Culture: NO GROWTH

## 2016-06-04 LAB — BASIC METABOLIC PANEL
ANION GAP: 9 (ref 5–15)
BUN: 17 mg/dL (ref 6–20)
CALCIUM: 8.1 mg/dL — AB (ref 8.9–10.3)
CO2: 22 mmol/L (ref 22–32)
CREATININE: 1.52 mg/dL — AB (ref 0.61–1.24)
Chloride: 114 mmol/L — ABNORMAL HIGH (ref 101–111)
GFR calc non Af Amer: 39 mL/min — ABNORMAL LOW (ref >60)
GFR, EST AFRICAN AMERICAN: 45 mL/min — AB (ref >60)
Glucose, Bld: 113 mg/dL — ABNORMAL HIGH (ref 65–99)
Potassium: 3.6 mmol/L (ref 3.5–5.1)
SODIUM: 145 mmol/L (ref 135–145)

## 2016-06-04 LAB — CBC
HEMATOCRIT: 28.1 % — AB (ref 40.0–52.0)
Hemoglobin: 9.5 g/dL — ABNORMAL LOW (ref 13.0–18.0)
MCH: 31 pg (ref 26.0–34.0)
MCHC: 33.9 g/dL (ref 32.0–36.0)
MCV: 91.6 fL (ref 80.0–100.0)
PLATELETS: 134 10*3/uL — AB (ref 150–440)
RBC: 3.07 MIL/uL — ABNORMAL LOW (ref 4.40–5.90)
RDW: 13.2 % (ref 11.5–14.5)
WBC: 5.8 10*3/uL (ref 3.8–10.6)

## 2016-06-04 LAB — GLUCOSE, CAPILLARY: GLUCOSE-CAPILLARY: 125 mg/dL — AB (ref 65–99)

## 2016-06-04 MED ORDER — SODIUM CHLORIDE 0.9 % IV SOLN: 250.0000 mL | INTRAVENOUS | Status: DC | PRN

## 2016-06-04 MED ORDER — MORPHINE SULFATE (PF) 4 MG/ML IV SOLN
1.0000 mg | INTRAVENOUS | Status: DC | PRN
  Administered 2016-06-04 – 2016-06-06 (×6): 1 mg via INTRAVENOUS
  Filled 2016-06-04 (×5): qty 1

## 2016-06-04 MED ORDER — SODIUM CHLORIDE 0.9% FLUSH: 3.0000 mL | INTRAVENOUS | Status: DC | PRN

## 2016-06-04 MED ORDER — SODIUM CHLORIDE 0.9% FLUSH
3.0000 mL | Freq: Two times a day (BID) | INTRAVENOUS | Status: DC
  Administered 2016-06-04 – 2016-06-06 (×5): 3 mL via INTRAVENOUS

## 2016-06-04 MED ORDER — ONDANSETRON 4 MG PO TBDP
4.0000 mg | ORAL_TABLET | Freq: Four times a day (QID) | ORAL | Status: DC | PRN
  Filled 2016-06-04: qty 1

## 2016-06-04 MED ORDER — LORAZEPAM 1 MG PO TABS: 1.0000 mg | ORAL_TABLET | ORAL | Status: DC | PRN

## 2016-06-04 MED ORDER — HALOPERIDOL LACTATE 2 MG/ML PO CONC
0.5000 mg | ORAL | Status: DC | PRN
  Filled 2016-06-04: qty 0.3

## 2016-06-04 MED ORDER — ACETAMINOPHEN 650 MG RE SUPP: 650.0000 mg | Freq: Four times a day (QID) | RECTAL | Status: DC | PRN

## 2016-06-04 MED ORDER — GLYCOPYRROLATE 1 MG PO TABS
1.0000 mg | ORAL_TABLET | ORAL | Status: DC | PRN
  Filled 2016-06-04: qty 1

## 2016-06-04 MED ORDER — GLYCOPYRROLATE 0.2 MG/ML IJ SOLN
0.2000 mg | INTRAMUSCULAR | Status: DC | PRN
  Filled 2016-06-04: qty 1

## 2016-06-04 MED ORDER — ONDANSETRON HCL 4 MG/2ML IJ SOLN: 4.0000 mg | Freq: Four times a day (QID) | INTRAMUSCULAR | Status: DC | PRN

## 2016-06-04 MED ORDER — LORAZEPAM 2 MG/ML PO CONC
1.0000 mg | ORAL | Status: DC | PRN
  Filled 2016-06-04: qty 0.5

## 2016-06-04 MED ORDER — LORAZEPAM 2 MG/ML IJ SOLN
1.0000 mg | INTRAMUSCULAR | Status: DC | PRN
  Administered 2016-06-05 – 2016-06-06 (×2): 1 mg via INTRAVENOUS
  Filled 2016-06-04 (×2): qty 1

## 2016-06-04 MED ORDER — BIOTENE DRY MOUTH MT LIQD: 15.0000 mL | OROMUCOSAL | Status: DC | PRN

## 2016-06-04 MED ORDER — HALOPERIDOL LACTATE 5 MG/ML IJ SOLN
0.5000 mg | INTRAMUSCULAR | Status: DC | PRN
  Administered 2016-06-05 (×2): 0.5 mg via INTRAVENOUS
  Filled 2016-06-04 (×2): qty 1

## 2016-06-04 MED ORDER — LORAZEPAM 2 MG/ML IJ SOLN
1.0000 mg | Freq: Once | INTRAMUSCULAR | Status: AC
  Administered 2016-06-04: 1 mg via INTRAVENOUS
  Filled 2016-06-04: qty 1

## 2016-06-04 MED ORDER — HALOPERIDOL 1 MG PO TABS: 0.5000 mg | ORAL_TABLET | ORAL | Status: DC | PRN

## 2016-06-04 MED ORDER — POLYVINYL ALCOHOL 1.4 % OP SOLN
1.0000 [drp] | Freq: Four times a day (QID) | OPHTHALMIC | Status: DC | PRN
  Filled 2016-06-04: qty 15

## 2016-06-04 MED ORDER — LORAZEPAM BOLUS VIA INFUSION: 1.0000 mg | Freq: Once | INTRAVENOUS | Status: DC

## 2016-06-04 MED ORDER — ACETAMINOPHEN 325 MG PO TABS: 650.0000 mg | ORAL_TABLET | Freq: Four times a day (QID) | ORAL | Status: DC | PRN

## 2016-06-04 NOTE — Care Management (Signed)
Patient is now comfort care and palliative has documented he meets criteria for residential hospice facility.  Family in agreement and facility preference is [facility].  Notified Flo Shanks

## 2016-06-04 NOTE — Clinical Social Work Note (Signed)
CSW was informed by case manager that patient's family would like to go to [facility] of [facility], referral was made to hospice nurse liason.  Jones Broom. Rainn Zupko, MSW, [place]  06/04/2016 11:45 AM

## 2016-06-04 NOTE — Progress Notes (Signed)
Patient ID: Guy Hudson, male   DOB: Apr 13, 1924, 83 y.o.   MRN: [id number]  [employer] PROGRESS NOTE  Guy Hudson [id number] DOB: 1923-07-30 DOA: 05/30/2016 PCP: Albina Billet, MD  HPI/Subjective: No new complaints, very confused and sleepy,   Objective: Vitals:   06/04/16 0330 06/04/16 0847  BP: (!) 189/87 (!) 159/75  Pulse: (!) 111 79  Resp:    Temp: 98.1 F (36.7 C)     Filed Weights   05/30/16 2316 05/31/16 0625  Weight: 81.6 kg (180 lb) 82.2 kg (181 lb 3.2 oz)    ROS: Review of Systems  Unable to perform ROS: Acuity of condition   Exam: Physical Exam  HENT:  Nose: No mucosal edema.  Mouth/Throat: No oropharyngeal exudate or posterior oropharyngeal edema.  Eyes: Conjunctivae, EOM and lids are normal. Pupils are equal, round, and reactive to light.  Neck: No JVD present. Carotid bruit is not present. No edema present. No thyroid mass and no thyromegaly present.  Cardiovascular: S1 normal and S2 normal.  Exam reveals no gallop.   No murmur heard. Pulses:      Dorsalis pedis pulses are 2+ on the right side, and 2+ on the left side.  Respiratory: No respiratory distress. He has no wheezes. He has no rhonchi. He has no rales.  GI: Soft. Bowel sounds are normal. There is no tenderness.  Musculoskeletal:       Right ankle: He exhibits no swelling.       Left ankle: He exhibits no swelling.  Lymphadenopathy:    He has no cervical adenopathy.  Neurological: He is alert. He is disoriented. No cranial nerve deficit.  Pleasantly confused  Skin: Skin is warm. Nails show no clubbing.  Left buttock near the rectal area some area of fluctuance and redness with some bruising over it.  Psychiatric: He has a normal mood and affect.     Data Reviewed: Basic Metabolic Panel:  Recent Labs Lab 05/30/16 2324 05/31/16 0710 06/01/16 [id number] 06/02/16 0420 06/04/16 0527  NA 140 137 141 142 145  K 4.8 4.7 4.4 4.2 3.6  CL 107 110 113* 115* 114*  CO2 27 22 22 22 22    GLUCOSE 254* 267* 187* 166* 113*  BUN 40* 37* 34* 30* 17  CREATININE 2.30* 2.12* 1.98* 2.06* 1.52*  CALCIUM 8.2* 7.4* 7.6* 7.8* 8.1*   CBC:  Recent Labs Lab 05/30/16 2324 05/31/16 0710 06/01/16 0638 06/02/16 0420 06/04/16 0527  WBC 8.3 8.7 6.2 4.9 5.8  HGB 11.3* 9.8* 9.3* 9.4* 9.5*  HCT 33.0* 29.7* 27.5* 27.6* 28.1*  MCV 93.3 94.2 93.6 94.2 91.6  PLT 142* 126* 113* 102* 134*   Cardiac Enzymes:  Recent Labs Lab 05/30/16 2324  TROPONINI 0.03*    CBG:  Recent Labs Lab 06/02/16 2139 06/03/16 0752 06/03/16 1801 06/03/16 2111 06/04/16 0813  GLUCAP 275* 179* 225* 193* 125*    Recent Results (from the past 240 hour(s))  Blood Culture (routine x 2)     Status: None   Collection Time: 05/30/16 11:24 PM  Result Value Ref Range Status   Specimen Description BLOOD  R AC  Final   Special Requests   Final    BOTTLES DRAWN AEROBIC AND ANAEROBIC  AER 13  ANA 12    Culture NO GROWTH 5 DAYS  Final   Report Status 06/04/2016 FINAL  Final  Blood Culture (routine x 2)     Status: None   Collection Time: 05/30/16 11:24 PM  Result Value Ref  Range Status   Specimen Description BLOOD  R WRIST   Final   Special Requests   Final    BOTTLES DRAWN AEROBIC AND ANAEROBIC  AER 10 ANA 12 ML   Culture NO GROWTH 5 DAYS  Final   Report Status 06/04/2016 FINAL  Final  Urine culture     Status: None   Collection Time: 05/30/16 11:25 PM  Result Value Ref Range Status   Specimen Description URINE, RANDOM  Final   Special Requests NONE  Final   Culture NO GROWTH Performed at [facility]   Final   Report Status 06/01/2016 FINAL  Final  Rapid Influenza A&B Antigens ([place] only)     Status: None   Collection Time: 05/31/16  2:20 AM  Result Value Ref Range Status   Influenza A ([place]) NEGATIVE NEGATIVE Final   Influenza B ([facility]) NEGATIVE NEGATIVE Final  Culture, blood (x 2)     Status: None (Preliminary result)   Collection Time: 05/31/16  7:10 AM  Result Value Ref Range Status    Specimen Description BLOOD LEFT AC  Final   Special Requests BOTTLES DRAWN AEROBIC AND ANAEROBIC AER7ML ANA7ML  Final   Culture NO GROWTH 4 DAYS  Final   Report Status PENDING  Incomplete  Culture, blood (x 2)     Status: None (Preliminary result)   Collection Time: 05/31/16  8:03 AM  Result Value Ref Range Status   Specimen Description BLOOD LEFT AC  Final   Special Requests   Final    BOTTLES DRAWN AEROBIC AND ANAEROBIC ANA 10ML AER 10ML   Culture NO GROWTH 4 DAYS  Final   Report Status PENDING  Incomplete     Studies: Nm Bone Scan Whole Body  Result Date: 06/03/2016 CLINICAL DATA:  Prostate cancer EXAM: NUCLEAR MEDICINE WHOLE BODY BONE SCAN TECHNIQUE: Whole body anterior and posterior images were obtained approximately 3 hours after intravenous injection of radiopharmaceutical. RADIOPHARMACEUTICALS:  23.1 mCi Technetium-62m MDP IV COMPARISON:  CT 05/31/2016 FINDINGS: Innumerable bony metastases noted throughout the lower cervical, thoracic and lumbar spine, multiple bilateral ribs, sternum, pelvis, shoulders, humeri, and bilateral proximal femurs. Possible skullbase metastasis. IMPRESSION: Extensive bony metastatic disease. Electronically Signed   By: Rolm Baptise M.D.   On: 06/03/2016 11:48    Scheduled Meds: . insulin detemir  8 Units Subcutaneous QHS  . polyethylene glycol  17 g Oral Daily  . sodium chloride flush  3 mL Intravenous Q12H  . sodium chloride flush  3 mL Intravenous Q12H   Continuous Infusions:   Assessment/Plan:  1. Sepsis: Present on admission. Now comfort care 2. Sinus tachycardia/hypertension: Now comfort care 3. Acute metabolic encephalopathy:Now comfort care 4. Metastatic prostate cancer: Now comfort care 5. Gross hematuria 6. Weakness  7. Type 2 diabetes mellitus  8. Hyperlipidemia  9. BPH     Code Status: DNR    Code Status Orders        Start     Ordered   05/31/16 0249  Full code  Continuous     05/31/16 0249    Code Status History     Date Active Date Inactive Code Status Order ID Comments User Context   This patient has a current code status but no historical code status.     Family Communication: son via phone Disposition Plan: Hospice Home tomorrow  Consultants:  Urology  General surgery  Antibiotics:  stopped  Time spent: 25 minutes  Kavan Devan [employer]

## 2016-06-04 NOTE — Progress Notes (Signed)
Hospice home referral received from [facility]. Guy Hudson is a 12 year man admitted to [facility] on 1/4 for evaluation of fever and altered mental status. He has a known history of elevated PSA, DM II, HTN. Per chart note review he has been told in the past that the elevated PSA was most likely Prostate cancer, but has not had any work up. A bone scan on 1/8 showed extensive bony metastases. Palliative Medicine was consulted for goals of care. Family met with Palliative Medicine NP Guy Hudson and have chosen to focus on comfort with transfer to the hospice home. Hospital care team all aware that there is currently no bed availability. Writer met in the patient's room with his son Guy Hudson, niece Guy Hudson and family friend Guy Hudson to initiate education regarding hospice services, philosophy and team approach to a care with good understanding voiced. Questions answered, consents signed. Family aware there is currently no bed availability.  Patient did not participate in the conversation, he did awaken to voice and Recruitment consultant.  Of note patient did groan and grimace several times through out the visit. Writer discussed pain control with patient's family, they voiced understanding and agreement with need. Writer spoke with staff RN Janett Billow, who will give PRN pain medication.  Pateint information faxed to referral. Will continue to follow through final disposition. Flo Shanks RN, BSN, [place] [facility] [place], [facility] [phone] c

## 2016-06-04 NOTE — Progress Notes (Signed)
                                                                                                                                                                                                         Daily Progress Note   Patient Name: Guy Hudson       Date: 06/04/2016 DOB: 11/17/1923  Age: 83 y.o. MRN#: [id number] Attending Physician: Vipul Shah, MD Primary Care Physician: TATE,DENNY C, MD Admit Date: 05/30/2016  Reason for Consultation/Follow-up: Establishing goals of care and Terminal Care  Subjective: Met with Lynn this morning. Patient was sleeping. Pulled his IV out yesterday. Now wearing mitts. Not eating. Not ambulating. Bone scan revealed diffuse bony mets. Lynn declined further aggressive care and requested transition to comfort care and evaluation for residential hospice.  Review of Systems  Unable to perform ROS: Mental status change    Length of Stay: 4  Current Medications: Scheduled Meds:  . atorvastatin  40 mg Oral q1800  . insulin aspart  0-5 Units Subcutaneous QHS  . insulin aspart  0-9 Units Subcutaneous TID WC  . insulin detemir  8 Units Subcutaneous QHS  . metoprolol tartrate  25 mg Oral BID  . polyethylene glycol  17 g Oral Daily  . sodium chloride flush  3 mL Intravenous Q12H  . terazosin  5 mg Oral QHS    Continuous Infusions: . sodium chloride 40 mL/hr at 06/03/16 2334    PRN Meds: acetaminophen **OR** acetaminophen, LORazepam, metoprolol, ondansetron **OR** ondansetron (ZOFRAN) IV, senna-docusate  Physical Exam  Cardiovascular: Normal rate and regular rhythm.   Pulmonary/Chest: Effort normal.  Abdominal: Soft. Bowel sounds are normal.  Neurological:  Somnolent, difficult to arouse  Nursing note and vitals reviewed.           Vital Signs: BP (!) 159/75   Pulse 79   Temp 98.1 F (36.7 C) (Oral)   Resp 18   Ht 5' 10" (1.778 m)   Wt 82.2 kg (181 lb 3.2 oz)   SpO2 97%   BMI 26.00 kg/m  SpO2: SpO2: 97 % O2 Device: O2 Device: Not  Delivered O2 Flow Rate: O2 Flow Rate (L/min): 1 L/min  Intake/output summary:  Intake/Output Summary (Last 24 hours) at 06/04/16 0951 Last data filed at 06/04/16 0353  Gross per 24 hour  Intake           887.33 ml  Output              400 ml  Net           487.33   ml   LBM: Last BM Date: 06/02/16 Baseline Weight: Weight: 81.6 kg (180 lb) Most recent weight: Weight: 82.2 kg (181 lb 3.2 oz)       Palliative Assessment/Data: PPS: 10%    Flowsheet Rows   Flowsheet Row Most Recent Value  Intake Tab  Referral Department  Hospitalist  Unit at Time of Referral  Cardiac/Telemetry Unit  Palliative Care Primary Diagnosis  Cancer  Date Notified  06/03/16  Palliative Care Type  New Palliative care  Reason for referral  Clarify Goals of Care  Date of Admission  06/02/16  Date first seen by Palliative Care  06/03/16  # of days Palliative referral response time  0 Day(s)  # of days IP prior to Palliative referral  1  Clinical Assessment  Psychosocial & Spiritual Assessment  Social Work Plan of Care  Advance care planning  Palliative Care Outcomes  Patient/Family meeting held?  Yes  Who was at the meeting?  son- [name]  Palliative Care Outcomes  Changed CPR status  Palliative Care follow-up planned  Yes, Facility      Patient Active Problem List   Diagnosis Date Noted  . Goals of care, counseling/discussion   . Do not resuscitate   . Advance care planning   . Pressure injury of skin 06/02/2016  . Decubitus ulcer of sacral region, stage 2   . Prostate cancer ([place])   . Gross hematuria   . Sepsis ([place]) 05/31/2016    Palliative Care Assessment & Plan   Patient Profile: 83 y.o. male  with past medical history of HTN, DM2, elevated PSA admitted on 05/30/2016 with fever and altered mental status. Workup revealed sepsis.  Patient has history of elevated PSA, but has not been worked up further, he has been told it is likely prostate cancer, but no treatment or workup was planned. CT scan  reveals bone lesions that could possibly be bone metastasis. Urology has been consulted and offered androgen deprivation therapy along with palliative bisphosphonate tx for bone mets.   Assessment/Recommendations/Plan   Metastatic cancer (presumed primary prostate)- diffuse spinal and skull mets. Possible spinal cord compression. Given advanced metastatic cancer, family has chosen comfort care and evaluation for residential hospice.   Delirium- Lynn's friend state he had noticed some recent ongoing going changes in patient's cognition- I am concerned for brain mets in addition to bone and likely spinal metastasis as well. This could also be terminal delirium.   Significant functional decline- patient is not eating or ambulating.   Comfort measures  Referral to social work for residential hospice placement  Goals of Care and Additional Recommendations:  Limitations on Scope of Treatment: Full Comfort Care  Code Status:  DNR  Prognosis:   < 2 weeks d/t transition to comfort measures in setting of diffusely metastatic cancer, significant functional decline  Discharge Planning:  Hospice facility  Care plan was discussed with patient's son, [name].  Thank you for allowing the Palliative Medicine Team to assist in the care of this patient.   Time In: 0915 Time Out: 1000 Total Time 45 mins Prolonged Time Billed No      Greater than 50%  of this time was spent counseling and coordinating care related to the above assessment and plan.  Mariana Kaufman, AGNP-C Palliative Medicine   Please contact Palliative Medicine Team phone at [phone] for questions and concerns.

## 2016-06-05 DIAGNOSIS — Z515 Encounter for palliative care: Secondary | ICD-10-CM

## 2016-06-05 LAB — CULTURE, BLOOD (ROUTINE X 2)
CULTURE: NO GROWTH
Culture: NO GROWTH

## 2016-06-05 NOTE — Plan of Care (Signed)
Problem: Pain Management: Goal: Satisfaction with pain management regimen will improve Outcome: Progressing Grimacing and complaints of pain with repositioning for incontinence care.  Sleeping and arousable after IV Morphine given.

## 2016-06-05 NOTE — Progress Notes (Signed)
Pt will be transferring to 1C room 111, report was giving to [place], Therapist, sports. PT has been packed up and is ready to transfer. Informed wife, [place] that pt will being transferring downstairs due to him not having to be on cardiac monitoring anymore, wife is agreeable. Will continue to monitor. Conley Simmonds, RN, BSN

## 2016-06-05 NOTE — Progress Notes (Addendum)
Palliative medicine progress note:   Patient resting comfortable. Awaiting bed at residential hospice home. Lynn at bedside. Notes patient grimaces and moans with repositioning, turning, or cleaning. Order entered to premed patient with morphine before doing these activities.   Total time: 15 minutes  Greater than 50% of this time was spent in counseling and coordination of care.   Mariana Kaufman, AGNP-C Palliative Medicine  Please call Palliative Medicine team phone with any questions [phone]. For individual providers please see [facility].

## 2016-06-05 NOTE — Clinical Social Work Note (Signed)
CSW was informed that Guy Hudson does not have any beds available yet, CSW to continue to follow patient's progress throughout discharge planning.  Guy Hudson. [place], MSW, [place]  06/05/2016 12:00 PM

## 2016-06-05 NOTE — Progress Notes (Signed)
Dr. Manuella Ghazi notified patient transferred to room 111 for comfort care. Verbal order read back and verified for RN may pronounce death.

## 2016-06-05 NOTE — Progress Notes (Signed)
Patient ID: Guy Hudson, male   DOB: [birth year], 83 y.o.   MRN: [id number]  [employer] PROGRESS NOTE  Guy Hudson [id number] DOB: 1923-07-12 DOA: 05/30/2016 PCP: Albina Billet, MD  HPI/Subjective: Somewhat alert today, son at bedside. Still waiting for Hospice home bed  Objective: Vitals:   06/04/16 0330 06/04/16 0847  BP: (!) 189/87 (!) 159/75  Pulse: (!) 111 79  Resp:    Temp: 98.1 F (36.7 C)     Filed Weights   05/30/16 2316 05/31/16 0625  Weight: 81.6 kg (180 lb) 82.2 kg (181 lb 3.2 oz)    ROS: Review of Systems  Unable to perform ROS: Acuity of condition   Exam: Physical Exam  HENT:  Nose: No mucosal edema.  Mouth/Throat: No oropharyngeal exudate or posterior oropharyngeal edema.  Eyes: Conjunctivae, EOM and lids are normal. Pupils are equal, round, and reactive to light.  Neck: No JVD present. Carotid bruit is not present. No edema present. No thyroid mass and no thyromegaly present.  Cardiovascular: S1 normal and S2 normal.  Exam reveals no gallop.   No murmur heard. Pulses:      Dorsalis pedis pulses are 2+ on the right side, and 2+ on the left side.  Respiratory: No respiratory distress. He has no wheezes. He has no rhonchi. He has no rales.  GI: Soft. Bowel sounds are normal. There is no tenderness.  Musculoskeletal:       Right ankle: He exhibits no swelling.       Left ankle: He exhibits no swelling.  Lymphadenopathy:    He has no cervical adenopathy.  Neurological: He is alert. He is disoriented. No cranial nerve deficit.  Pleasantly confused  Skin: Skin is warm. Nails show no clubbing.  Left buttock near the rectal area some area of fluctuance and redness with some bruising over it.  Psychiatric: He has a normal mood and affect.     Data Reviewed: Basic Metabolic Panel:  Recent Labs Lab 05/30/16 2324 05/31/16 0710 06/01/16 [id number] 06/02/16 0420 06/04/16 0527  NA 140 137 141 142 145  K 4.8 4.7 4.4 4.2 3.6  CL 107 110 113* 115*  114*  CO2 27 22 22 22 22   GLUCOSE 254* 267* 187* 166* 113*  BUN 40* 37* 34* 30* 17  CREATININE 2.30* 2.12* 1.98* 2.06* 1.52*  CALCIUM 8.2* 7.4* 7.6* 7.8* 8.1*   CBC:  Recent Labs Lab 05/30/16 2324 05/31/16 0710 06/01/16 0638 06/02/16 0420 06/04/16 0527  WBC 8.3 8.7 6.2 4.9 5.8  HGB 11.3* 9.8* 9.3* 9.4* 9.5*  HCT 33.0* 29.7* 27.5* 27.6* 28.1*  MCV 93.3 94.2 93.6 94.2 91.6  PLT 142* 126* 113* 102* 134*   Cardiac Enzymes:  Recent Labs Lab 05/30/16 2324  TROPONINI 0.03*    CBG:  Recent Labs Lab 06/02/16 2139 06/03/16 0752 06/03/16 1801 06/03/16 2111 06/04/16 0813  GLUCAP 275* 179* 225* 193* 125*    Recent Results (from the past 240 hour(s))  Blood Culture (routine x 2)     Status: None   Collection Time: 05/30/16 11:24 PM  Result Value Ref Range Status   Specimen Description BLOOD  R AC  Final   Special Requests   Final    BOTTLES DRAWN AEROBIC AND ANAEROBIC  AER 13  ANA 12    Culture NO GROWTH 5 DAYS  Final   Report Status 06/04/2016 FINAL  Final  Blood Culture (routine x 2)     Status: None   Collection Time: 05/30/16 11:24 PM  Result Value Ref Range Status   Specimen Description BLOOD  R WRIST   Final   Special Requests   Final    BOTTLES DRAWN AEROBIC AND ANAEROBIC  AER 10 ANA 12 ML   Culture NO GROWTH 5 DAYS  Final   Report Status 06/04/2016 FINAL  Final  Urine culture     Status: None   Collection Time: 05/30/16 11:25 PM  Result Value Ref Range Status   Specimen Description URINE, RANDOM  Final   Special Requests NONE  Final   Culture NO GROWTH Performed at [facility]   Final   Report Status 06/01/2016 FINAL  Final  Rapid Influenza A&B Antigens ([place] only)     Status: None   Collection Time: 05/31/16  2:20 AM  Result Value Ref Range Status   Influenza A ([place]) NEGATIVE NEGATIVE Final   Influenza B ([facility]) NEGATIVE NEGATIVE Final  Culture, blood (x 2)     Status: None   Collection Time: 05/31/16  7:10 AM  Result Value Ref Range  Status   Specimen Description BLOOD LEFT AC  Final   Special Requests BOTTLES DRAWN AEROBIC AND ANAEROBIC AER7ML ANA7ML  Final   Culture NO GROWTH 5 DAYS  Final   Report Status 06/05/2016 FINAL  Final  Culture, blood (x 2)     Status: None   Collection Time: 05/31/16  8:03 AM  Result Value Ref Range Status   Specimen Description BLOOD LEFT AC  Final   Special Requests   Final    BOTTLES DRAWN AEROBIC AND ANAEROBIC ANA 10ML AER 10ML   Culture NO GROWTH 5 DAYS  Final   Report Status 06/05/2016 FINAL  Final     Studies: No results found.  Scheduled Meds: . insulin detemir  8 Units Subcutaneous QHS  . polyethylene glycol  17 g Oral Daily  . sodium chloride flush  3 mL Intravenous Q12H  . sodium chloride flush  3 mL Intravenous Q12H   Continuous Infusions:   Assessment/Plan:  1. Sepsis: Present on admission. Now comfort care 2. Sinus tachycardia/hypertension: Now comfort care 3. Acute metabolic encephalopathy:Now comfort care 4. Metastatic prostate cancer: Now comfort care 5. Gross hematuria 6. Weakness  7. Type 2 diabetes mellitus  8. Hyperlipidemia  9. BPH     Code Status: DNR, COMFORT CARE  Family Communication: son via phone Disposition Plan: Hospice Home tomorrow  Consultants:  Urology  General surgery  Antibiotics:  stopped  Time spent: 25 minutes  Guy Hudson [employer]

## 2016-06-05 NOTE — Progress Notes (Signed)
Follow up visit made to new Hospice home referral. Patient seen lying in bed, turned to his side, appeared to be sleeping. Per chart note review he required 2 dose of PRN IV morphine overnight for pain. No family present at bedside. No bed available for transfer at this time, will continue to follow. Hospital care team made aware. Flo Shanks RN, BSN, [facility] [place], hospital Liaison [phone] c

## 2016-06-06 MED ORDER — MORPHINE SULFATE (PF) 4 MG/ML IV SOLN
1.0000 mg | INTRAVENOUS | Status: DC | PRN
  Administered 2016-06-06 – 2016-06-07 (×4): 1 mg via INTRAVENOUS
  Filled 2016-06-06 (×4): qty 1

## 2016-06-06 NOTE — Progress Notes (Signed)
Patient ID: Guy Hudson, male   DOB: June 30, 1923, 83 y.o.   MRN: [id number]  [employer] PROGRESS NOTE  Guy Hudson [id number] DOB: Dec 30, 1923 DOA: 05/30/2016 PCP: Albina Billet, MD  HPI/Subjective: Still waiting for Hospice Home beds. Pt getting more and more obtunded  Objective: Vitals:   06/04/16 0847 06/06/16 0443  BP: (!) 159/75 (!) 97/44  Pulse: 79 (!) 121  Resp:  20  Temp:  97.8 F (36.6 C)    Filed Weights   05/30/16 2316 05/31/16 0625  Weight: 81.6 kg (180 lb) 82.2 kg (181 lb 3.2 oz)    ROS: Review of Systems  Unable to perform ROS: Acuity of condition   Exam: Physical Exam  HENT:  Nose: No mucosal edema.  Mouth/Throat: No oropharyngeal exudate or posterior oropharyngeal edema.  Eyes: Conjunctivae, EOM and lids are normal. Pupils are equal, round, and reactive to light.  Neck: No JVD present. Carotid bruit is not present. No edema present. No thyroid mass and no thyromegaly present.  Cardiovascular: S1 normal and S2 normal.  Exam reveals no gallop.   No murmur heard. Respiratory: No respiratory distress. He has no wheezes. He has no rhonchi. He has no rales.  GI: Soft. Bowel sounds are normal. There is no tenderness.  Musculoskeletal:       Right ankle: He exhibits no swelling.       Left ankle: He exhibits no swelling.  Lymphadenopathy:    He has no cervical adenopathy.  Neurological: He is unresponsive. No cranial nerve deficit.  Skin: Skin is warm. Nails show no clubbing.  Left buttock near the rectal area some area of fluctuance and redness with some bruising over it.     Data Reviewed: Basic Metabolic Panel:  Recent Labs Lab 05/30/16 2324 05/31/16 0710 06/01/16 [id number] 06/02/16 0420 06/04/16 0527  NA 140 137 141 142 145  K 4.8 4.7 4.4 4.2 3.6  CL 107 110 113* 115* 114*  CO2 27 22 22 22 22   GLUCOSE 254* 267* 187* 166* 113*  BUN 40* 37* 34* 30* 17  CREATININE 2.30* 2.12* 1.98* 2.06* 1.52*  CALCIUM 8.2* 7.4* 7.6* 7.8* 8.1*    CBC:  Recent Labs Lab 05/30/16 2324 05/31/16 0710 06/01/16 0638 06/02/16 0420 06/04/16 0527  WBC 8.3 8.7 6.2 4.9 5.8  HGB 11.3* 9.8* 9.3* 9.4* 9.5*  HCT 33.0* 29.7* 27.5* 27.6* 28.1*  MCV 93.3 94.2 93.6 94.2 91.6  PLT 142* 126* 113* 102* 134*   Cardiac Enzymes:  Recent Labs Lab 05/30/16 2324  TROPONINI 0.03*    CBG:  Recent Labs Lab 06/02/16 2139 06/03/16 0752 06/03/16 1801 06/03/16 2111 06/04/16 0813  GLUCAP 275* 179* 225* 193* 125*    Recent Results (from the past 240 hour(s))  Blood Culture (routine x 2)     Status: None   Collection Time: 05/30/16 11:24 PM  Result Value Ref Range Status   Specimen Description BLOOD  R AC  Final   Special Requests   Final    BOTTLES DRAWN AEROBIC AND ANAEROBIC  AER 13  ANA 12    Culture NO GROWTH 5 DAYS  Final   Report Status 06/04/2016 FINAL  Final  Blood Culture (routine x 2)     Status: None   Collection Time: 05/30/16 11:24 PM  Result Value Ref Range Status   Specimen Description BLOOD  R WRIST   Final   Special Requests   Final    BOTTLES DRAWN AEROBIC AND ANAEROBIC  AER 10 ANA 12  ML   Culture NO GROWTH 5 DAYS  Final   Report Status 06/04/2016 FINAL  Final  Urine culture     Status: None   Collection Time: 05/30/16 11:25 PM  Result Value Ref Range Status   Specimen Description URINE, RANDOM  Final   Special Requests NONE  Final   Culture NO GROWTH Performed at [facility]   Final   Report Status 06/01/2016 FINAL  Final  Rapid Influenza A&B Antigens ([place] only)     Status: None   Collection Time: 05/31/16  2:20 AM  Result Value Ref Range Status   Influenza A ([place]) NEGATIVE NEGATIVE Final   Influenza B ([facility]) NEGATIVE NEGATIVE Final  Culture, blood (x 2)     Status: None   Collection Time: 05/31/16  7:10 AM  Result Value Ref Range Status   Specimen Description BLOOD LEFT AC  Final   Special Requests BOTTLES DRAWN AEROBIC AND ANAEROBIC AER7ML ANA7ML  Final   Culture NO GROWTH 5 DAYS  Final    Report Status 06/05/2016 FINAL  Final  Culture, blood (x 2)     Status: None   Collection Time: 05/31/16  8:03 AM  Result Value Ref Range Status   Specimen Description BLOOD LEFT AC  Final   Special Requests   Final    BOTTLES DRAWN AEROBIC AND ANAEROBIC ANA 10ML AER 10ML   Culture NO GROWTH 5 DAYS  Final   Report Status 06/05/2016 FINAL  Final     Studies: No results found.  Scheduled Meds: . polyethylene glycol  17 g Oral Daily  . sodium chloride flush  3 mL Intravenous Q12H  . sodium chloride flush  3 mL Intravenous Q12H   Continuous Infusions:   Assessment/Plan:  1. Sepsis: Present on admission. Now comfort care 2. Sinus tachycardia/hypertension: Now comfort care 3. Acute metabolic encephalopathy:Now comfort care 4. Metastatic prostate cancer: Now comfort care 5. Gross hematuria 6. Weakness  7. Type 2 diabetes mellitus  8. Hyperlipidemia  9. BPH     Code Status: DNR, COMFORT CARE  Family Communication: son at bedside, who mentioned he will have to go his home today in [place] Disposition Plan: [place] when beds available  Consultants:  Urology  General surgery  Antibiotics:  stopped  Time spent: 15 minutes  Taiya Nutting [employer]

## 2016-06-06 NOTE — Progress Notes (Signed)
Nutrition Brief Note  Patient identified to be seen for low braden score. Chart reviewed and discussed with RN. Patient now transitioning to comfort care.   Patient on Dysphagia 2 diet with thin liquids. Per RN patient can have comfort feeds if alert enough. Family/RN will call for trays as requested by patient. No nutrition interventions warranted at this time. Please consult Dietitian as needed.   Willey Blade, MS, RD, LDN Pager: [phone] After Hours Pager: [phone]

## 2016-06-06 NOTE — Progress Notes (Signed)
Follow up visit made. Patient seen sitting up in bed, eyes closed, difficulty to rouse. He required 2 doses of 1 mg IV morphine and 1 dose of IV lorazepam overnight. Per discussion with attending physician Dr. Manuella Ghazi, Mr. Achee son [name] [place], Dr. Manuella Ghazi is expecting a hospital death. Hospital care team aware there is no bed availability at this time. Will continue to follow through final disposition. Flo Shanks RN, BSN, [facility] [place], [facility] [phone] c

## 2016-06-07 MED ORDER — LORAZEPAM 1 MG PO TABS: 1.0000 mg | ORAL_TABLET | ORAL | 0 refills | Status: AC | PRN

## 2016-06-07 MED ORDER — GLYCOPYRROLATE 1 MG PO TABS: 1.0000 mg | ORAL_TABLET | ORAL | Status: AC | PRN

## 2016-06-07 MED ORDER — MORPHINE SULFATE 20 MG/5ML PO SOLN: 5.0000 mg | ORAL | 0 refills | Status: AC | PRN

## 2016-06-07 MED ORDER — ONDANSETRON 4 MG PO TBDP: 4.0000 mg | ORAL_TABLET | Freq: Four times a day (QID) | ORAL | 0 refills | Status: AC | PRN

## 2016-06-07 MED ORDER — HALOPERIDOL 0.5 MG PO TABS: 0.5000 mg | ORAL_TABLET | ORAL | Status: AC | PRN

## 2016-06-07 NOTE — Care Management Important Message (Signed)
Important Message  Patient Details  Name: CORDARRIUS CORPE MRN: [id number] Date of Birth: 01-23-1924   Medicare Important Message Given:  Yes    Shelbie Ammons, RN 06/07/2016, 8:35 AM

## 2016-06-07 NOTE — Discharge Summary (Signed)
[place] [facility] [place] NAME: Guy Hudson    MR#:  [id number]  DATE OF BIRTH:  1923-06-17  DATE OF ADMISSION:  05/30/2016 ADMITTING PHYSICIAN: Saundra Shelling, MD  DATE OF DISCHARGE: 06/07/16 PRIMARY CARE PHYSICIAN: Albina Billet, MD    ADMISSION DIAGNOSIS:  Sepsis, due to unspecified organism ([place]) [A41.9]  DISCHARGE DIAGNOSIS:  Active Problems:   Sepsis ([place])   Prostate cancer ([place])   Gross hematuria   Pressure injury of skin   Decubitus ulcer of sacral region, stage 2   Goals of care, counseling/discussion   Do not resuscitate   Advance care planning   Terminal care   SECONDARY DIAGNOSIS:   Past Medical History:  Diagnosis Date  . Diabetes mellitus without complication ([place])   . Hypertension     HOSPITAL COURSE:  HISTORY OF PRESENT ILLNESS: Guy Hudson  is a 83 y.o. male with a known history of Hypertension, diabetes mellitus type 2, hyperlipidemia was brought from home for confusion and fever. Patient had a fever of 10 38F or also had occasional cough. No family members were available to get any history. Patient is arousable to loud verbal commands and painful stimuli. He is not completely oriented to time place and person. He was febrile when he presented to the emergency room and his oxygen saturation was around 88% on room air. Blood sugar on arrival was 3 29 mg/dL. Patient was put on oxygen via nasal cannula and IV fluids were given sepsis protocol. Patient received broad-spectrum IV antibiotics in the emergency room. Not much history could be obtained from the patient. Patient was worked up with CT chest and CT abdomen in the emergency room. CT chest showed bronchitis and CT abdomen no acute pathology.  Hospital course  Patient was admitted to the hospital with sepsis from prostatitis and viral infection patient was initially started on vancomycin and Zosyn. Patient was found to be altered from metabolic encephalopathy.  He also has metastatic prostate cancer and found to be tachycardic with gross hematuria. Seen by urology and surgery with no significant improvement. After discussing with the patient's son palliative care was consulted and patient CODE STATUS was changed to DO NOT RESUSCITATE with comfort care measures. Patient is still altered but comfortable during my examination today. Plan is to transfer him to hospice home, bed is available today    DISCHARGE CONDITIONS:   gaurded  CONSULTS OBTAINED:     PROCEDURES    DRUG ALLERGIES:  No Known Allergies  DISCHARGE MEDICATIONS:   Current Discharge Medication List    START taking these medications   Details  glycopyrrolate (ROBINUL) 1 MG tablet Take 1 tablet (1 mg total) by mouth every 4 (four) hours as needed (excessive secretions).    haloperidol (HALDOL) 0.5 MG tablet Take 1 tablet (0.5 mg total) by mouth every 4 (four) hours as needed for agitation (or delirium).    LORazepam (ATIVAN) 1 MG tablet Take 1 tablet (1 mg total) by mouth every 4 (four) hours as needed for anxiety. Qty: 30 tablet, Refills: 0    morphine 20 MG/5ML solution Take 1.3 mLs (5.2 mg total) by mouth every 2 (two) hours as needed for pain. Refills: 0    ondansetron (ZOFRAN-ODT) 4 MG disintegrating tablet Take 1 tablet (4 mg total) by mouth every 6 (six) hours as needed for nausea. Qty: 20 tablet, Refills: 0      CONTINUE these medications which have NOT CHANGED   Details  acetaminophen (  TYLENOL) 500 MG tablet Take 500-1,000 mg by mouth every 6 (six) hours as needed for mild pain or headache.      STOP taking these medications     aspirin EC 81 MG tablet      ferrous sulfate 325 (65 FE) MG tablet      furosemide (LASIX) 40 MG tablet      insulin aspart (NOVOLOG) 100 UNIT/ML injection      insulin detemir (LEVEMIR) 100 UNIT/ML injection      metoprolol (LOPRESSOR) 50 MG tablet      polyethylene glycol (MIRALAX) packet      simvastatin (ZOCOR) 80 MG  tablet      terazosin (HYTRIN) 5 MG capsule          DISCHARGE INSTRUCTIONS:   Comfort care  DIET:  Encourage fluids  DISCHARGE CONDITION:  Comfort care, diet as tolerated  ACTIVITY:  Bedrest  OXYGEN:  Home Oxygen: Yes.     Oxygen Delivery: 2 liters/min via As needed for comfort care  DISCHARGE LOCATION:  Hospice home   If you experience worsening of your admission symptoms, develop shortness of breath, life threatening emergency, suicidal or homicidal thoughts you must seek medical attention immediately by calling 911 or calling your MD immediately  if symptoms less severe.  You Must read complete instructions/literature along with all the possible adverse reactions/side effects for all the Medicines you take and that have been prescribed to you. Take any new Medicines after you have completely understood and accpet all the possible adverse reactions/side effects.   Please note  You were cared for by a hospitalist during your hospital stay. If you have any questions about your discharge medications or the care you received while you were in the hospital after you are discharged, you can call the unit and asked to speak with the hospitalist on call if the hospitalist that took care of you is not available. Once you are discharged, your primary care physician will handle any further medical issues. Please note that NO REFILLS for any discharge medications will be authorized once you are discharged, as it is imperative that you return to your primary care physician (or establish a relationship with a primary care physician if you do not have one) for your aftercare needs so that they can reassess your need for medications and monitor your lab values.     Today  Chief Complaint  Patient presents with  . Altered Mental Status   Patient is altered, just received morphine and comfortable  ROS: Unobtainable from altered mental status   VITAL SIGNS:  Blood pressure (!)  146/46, pulse (!) 115, temperature 98.9 F (37.2 C), temperature source Oral, resp. rate 20, height 5\' 10"  (1.778 m), weight 82.2 kg (181 lb 3.2 oz), SpO2 97 %.  I/O:    Intake/Output Summary (Last 24 hours) at 06/07/16 1126 Last data filed at 06/07/16 0256  Gross per 24 hour  Intake                0 ml  Output              100 ml  Net             -100 ml    PHYSICAL EXAMINATION:  GENERAL:  83 y.o.-year-old patient lying in the bed with no acute distress.  HEENT: Head atraumatic, normocephalic. NECK:  Supple, no jugular venous distention.  LUNGS: Normal breath sounds bilaterally, no wheezing, rales,rhonchi or crepitation. No use of accessory  muscles of respiration.  CARDIOVASCULAR: S1, S2 normal. No murmurs, rubs, or gallops.    DATA REVIEW:   CBC  Recent Labs Lab 06/04/16 0527  WBC 5.8  HGB 9.5*  HCT 28.1*  PLT 134*    Chemistries   Recent Labs Lab 06/04/16 0527  NA 145  K 3.6  CL 114*  CO2 22  GLUCOSE 113*  BUN 17  CREATININE 1.52*  CALCIUM 8.1*    Cardiac Enzymes No results for input(s): TROPONINI in the last 168 hours.  Microbiology Results  Results for orders placed or performed during the hospital encounter of 05/30/16  Blood Culture (routine x 2)     Status: None   Collection Time: 05/30/16 11:24 PM  Result Value Ref Range Status   Specimen Description BLOOD  R AC  Final   Special Requests   Final    BOTTLES DRAWN AEROBIC AND ANAEROBIC  AER 13  ANA 12    Culture NO GROWTH 5 DAYS  Final   Report Status 06/04/2016 FINAL  Final  Blood Culture (routine x 2)     Status: None   Collection Time: 05/30/16 11:24 PM  Result Value Ref Range Status   Specimen Description BLOOD  R WRIST   Final   Special Requests   Final    BOTTLES DRAWN AEROBIC AND ANAEROBIC  AER 10 ANA 12 ML   Culture NO GROWTH 5 DAYS  Final   Report Status 06/04/2016 FINAL  Final  Urine culture     Status: None   Collection Time: 05/30/16 11:25 PM  Result Value Ref Range Status    Specimen Description URINE, RANDOM  Final   Special Requests NONE  Final   Culture NO GROWTH Performed at [facility]   Final   Report Status 06/01/2016 FINAL  Final  Rapid Influenza A&B Antigens ([place] only)     Status: None   Collection Time: 05/31/16  2:20 AM  Result Value Ref Range Status   Influenza A ([place]) NEGATIVE NEGATIVE Final   Influenza B ([facility]) NEGATIVE NEGATIVE Final  Culture, blood (x 2)     Status: None   Collection Time: 05/31/16  7:10 AM  Result Value Ref Range Status   Specimen Description BLOOD LEFT AC  Final   Special Requests BOTTLES DRAWN AEROBIC AND ANAEROBIC AER7ML ANA7ML  Final   Culture NO GROWTH 5 DAYS  Final   Report Status 06/05/2016 FINAL  Final  Culture, blood (x 2)     Status: None   Collection Time: 05/31/16  8:03 AM  Result Value Ref Range Status   Specimen Description BLOOD LEFT AC  Final   Special Requests   Final    BOTTLES DRAWN AEROBIC AND ANAEROBIC ANA 10ML AER 10ML   Culture NO GROWTH 5 DAYS  Final   Report Status 06/05/2016 FINAL  Final    RADIOLOGY:  Nm Bone Scan Whole Body  Result Date: 06/03/2016 CLINICAL DATA:  Prostate cancer EXAM: NUCLEAR MEDICINE WHOLE BODY BONE SCAN TECHNIQUE: Whole body anterior and posterior images were obtained approximately 3 hours after intravenous injection of radiopharmaceutical. RADIOPHARMACEUTICALS:  23.1 mCi Technetium-46m MDP IV COMPARISON:  CT 05/31/2016 FINDINGS: Innumerable bony metastases noted throughout the lower cervical, thoracic and lumbar spine, multiple bilateral ribs, sternum, pelvis, shoulders, humeri, and bilateral proximal femurs. Possible skullbase metastasis. IMPRESSION: Extensive bony metastatic disease. Electronically Signed   By: Rolm Baptise M.D.   On: 06/03/2016 11:48    EKG:   Orders placed or performed during  the hospital encounter of 05/30/16  . EKG 12-Lead  . EKG 12-Lead  . ED EKG 12-Lead  . ED EKG 12-Lead  . EKG      Management plans discussed with the patient,  family and they are in agreement.  CODE STATUS:     Code Status Orders        Start     Ordered   06/04/16 1002  Do not attempt resuscitation (DNR)  Continuous    Question Answer Comment  In the event of cardiac or respiratory ARREST Do not call a "code blue"   In the event of cardiac or respiratory ARREST Do not perform Intubation, CPR, defibrillation or ACLS   In the event of cardiac or respiratory ARREST Use medication by any route, position, wound care, and other measures to relive pain and suffering. May use oxygen, suction and manual treatment of airway obstruction as needed for comfort.      06/04/16 1003    Code Status History    Date Active Date Inactive Code Status Order ID Comments User Context   06/03/2016 10:03 AM 06/04/2016 10:03 AM DNR [id number]  Earlie Counts, NP Inpatient   05/31/2016  2:49 AM 06/03/2016 10:03 AM Full Code [id number]  Saundra Shelling, MD ED      TOTAL TIME TAKING CARE OF THIS PATIENT: 42 minutes.   Note: This dictation was prepared with Dragon dictation along with smaller phrase technology. Any transcriptional errors that result from this process are unintentional.   @[facility] @  on 06/07/2016 at 11:26 AM  Between 7am to 6pm - Pager - [phone]  After 6pm go to [email or web address] - password [email or web address]  Office  [phone]  CC: Primary care physician; Albina Billet, MD

## 2016-06-07 NOTE — Clinical Social Work Note (Addendum)
Patient to be d/c'ed today to [facility].  Patient and family agreeable to plans will transport via ems hospice home nurse liason RN to call report.  Evette Cristal, MSW, [place]

## 2016-06-07 NOTE — Progress Notes (Signed)
Follow up visit to new  hospice home referral. Patient seen lying in bed, appeared to be sleeping. He has required 2 doses of IV morphine overnight for pain. He remains with no oral intake and some restlessness when awake. He is incontinent of urine and remains bed bound, dependent for all ADL's. Bed has become available for transport today. Hospital care team made aware. Writer spoke with patient's son [name] via telephone, he is returning to [place] today and will go directly to the hospice home. Report called to the hospice home, EMS notified for transport. Discharge summary faxed to referral. Thank you. Flo Shanks RN, BSN, [place] and [facility] [place], [facility] [phone] c

## 2016-06-27 DEATH — deceased

## 2016-07-16 DIAGNOSIS — Z515 Encounter for palliative care: Secondary | ICD-10-CM

## 2018-09-12 IMAGING — DX DG CHEST 1V PORT
1 series · 1 of 1 positions shown · non-contrast
Comparison: None.

CLINICAL DATA: Altered mental status beginning at 3322 hours after
sneezing. Hyperglycemia.

EXAM:
PORTABLE CHEST 1 VIEW

[chest ap]
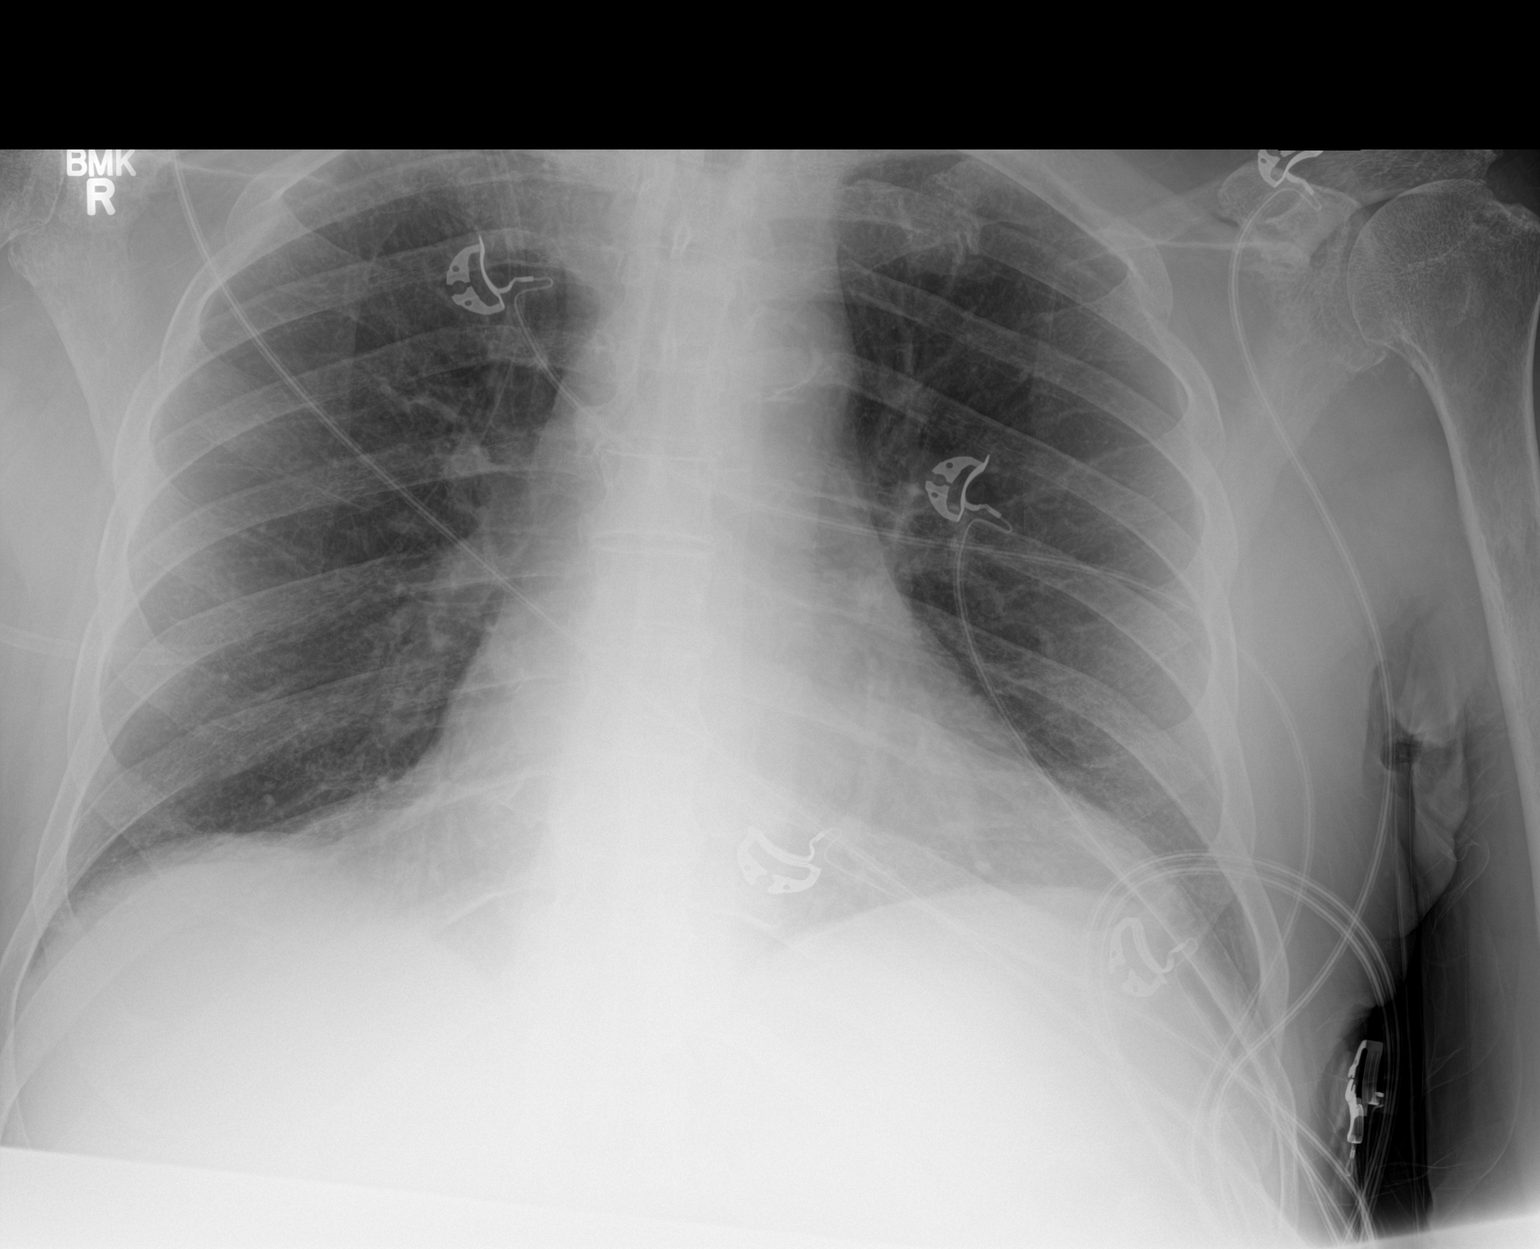

[1 of 1 positions shown; findings below may reference images not displayed]

FINDINGS: Cardiomediastinal silhouette is normal. Mildly calcified aortic
knob. No pleural effusions or focal consolidations. Trachea projects
midline and there is no pneumothorax. Soft tissue planes and
included osseous structures are non-suspicious.
IMPRESSION: No acute cardiopulmonary process.

## 2018-09-12 IMAGING — CT CT HEAD W/O CM
3 series · 15 of 47 positions shown, 18 images · non-contrast
Comparison: CT HEAD December 19, 2014

CLINICAL DATA: Altered mental status beginning at 5500 hours
tonight after sneezing. Hyperglycemia. History of hypertension and
diabetes.

EXAM:
CT HEAD WITHOUT CONTRAST
TECHNIQUE: Contiguous axial images were obtained from the base of the skull
through the vertex without intravenous contrast.

[Series 3: ax head wo · axial · 0.41mm/px · z∈[-96,+27]mm · 9 of 32 slices shown, 12 images]
[im 3/32  brain]
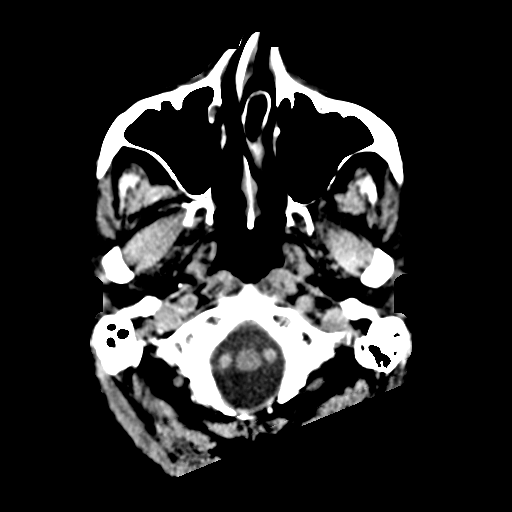
[im 3/32  bone]
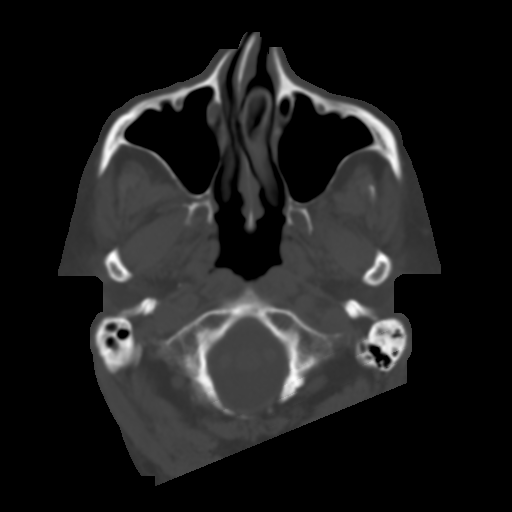
[im 6/32  brain]
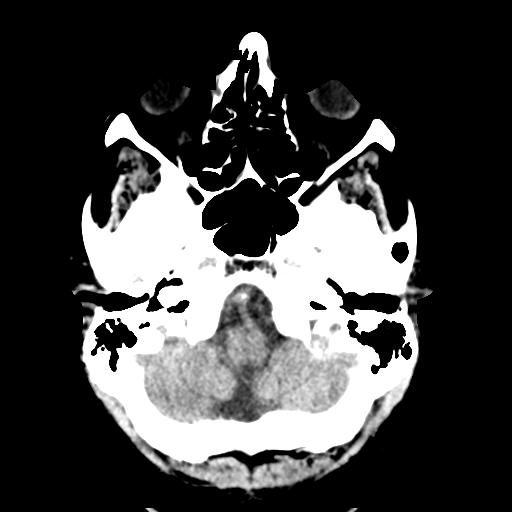
[im 9/32  brain]
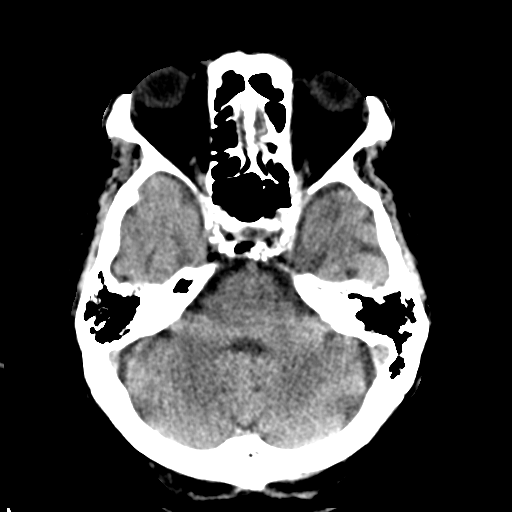
[im 12/32  brain]
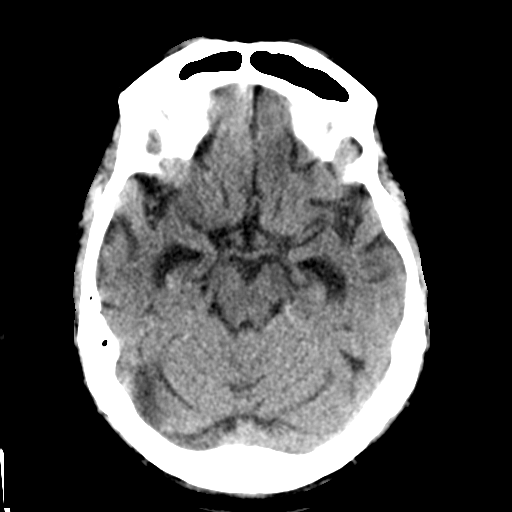
[im 17/32  brain]
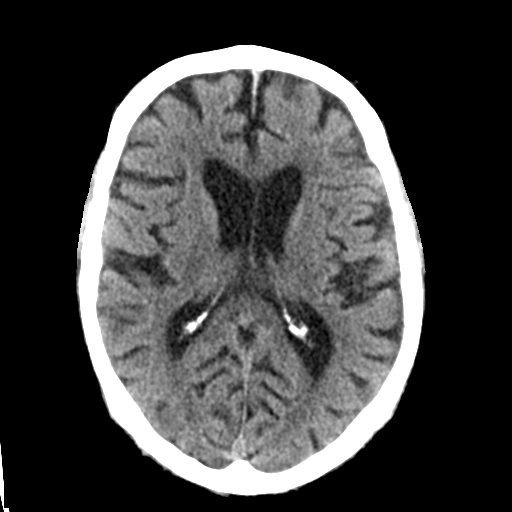
[im 17/32  bone]
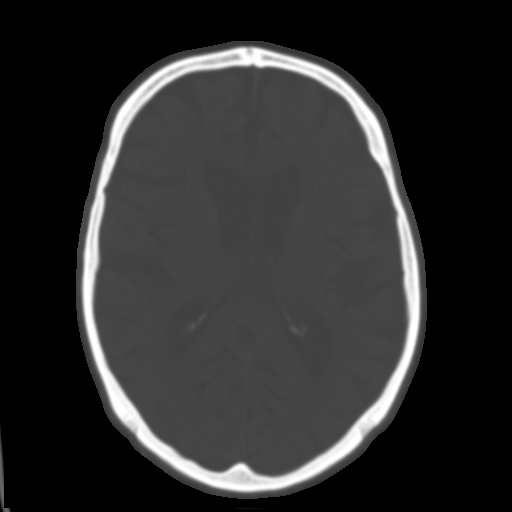
[im 20/32  brain]
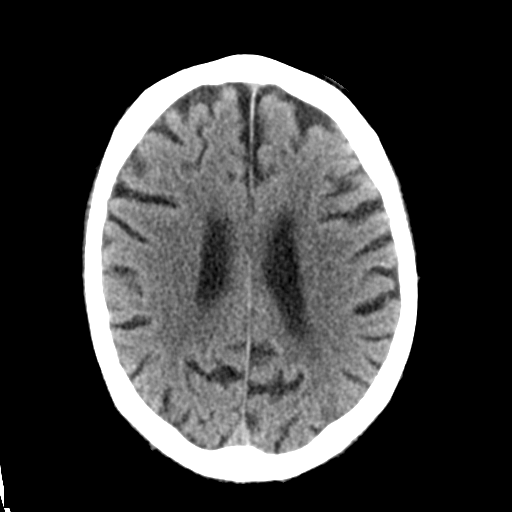
[im 23/32  brain]
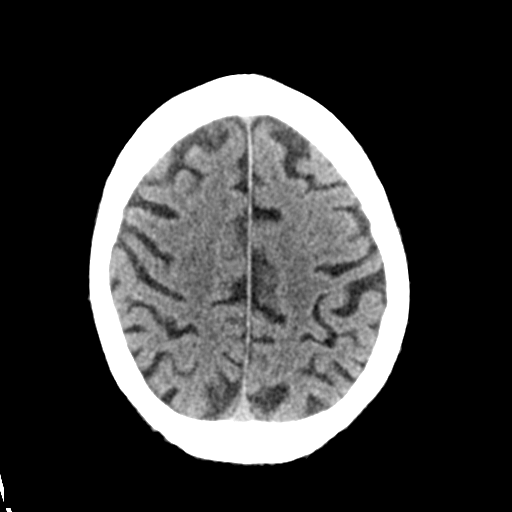
[im 26/32  brain]
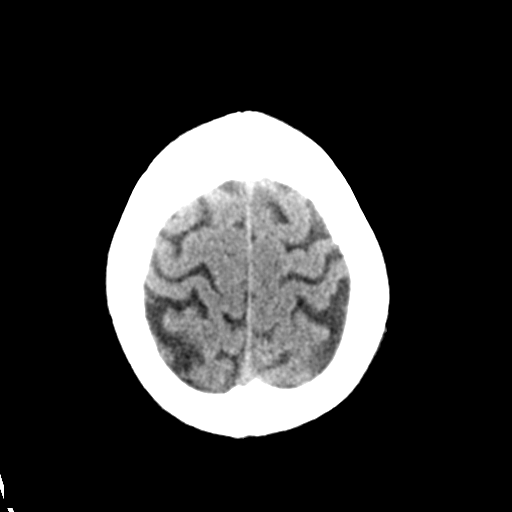
[im 29/32  brain]
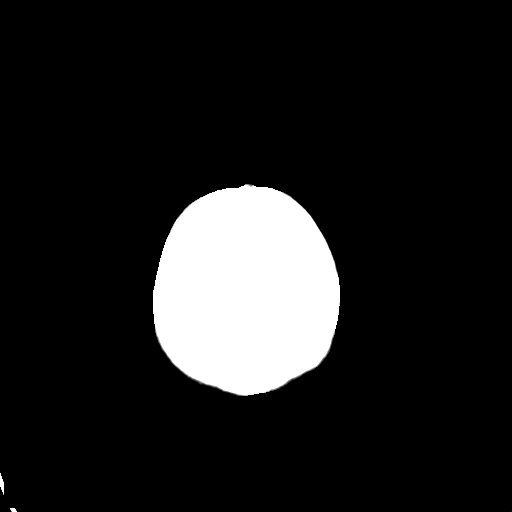
[im 29/32  bone]
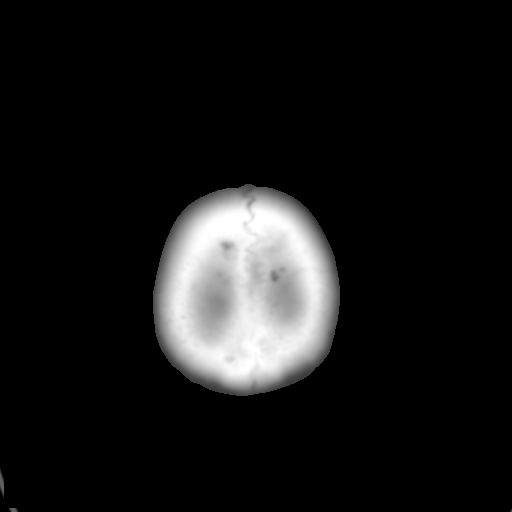

[Series 5: coronal soft tissue · coronal · 0.33mm/px · 3 of 65 slices shown]
[im 22/65  brain]
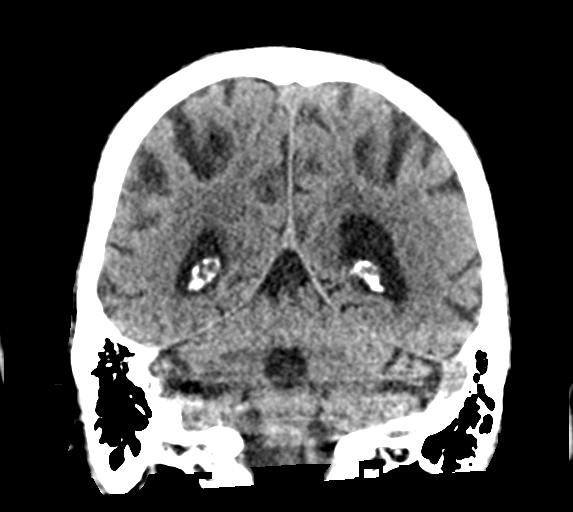
[im 29/65  brain]
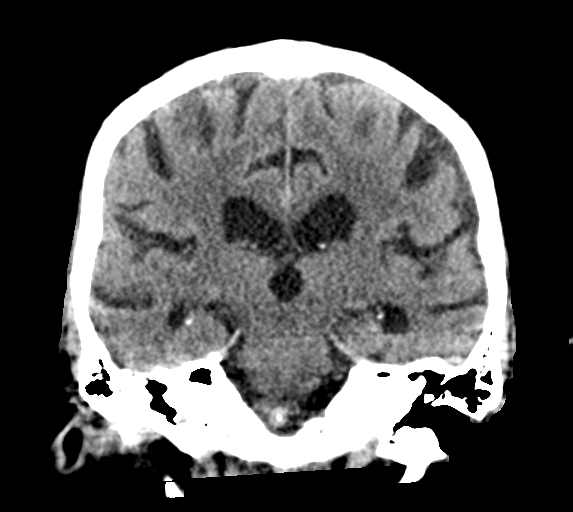
[im 36/65  brain]
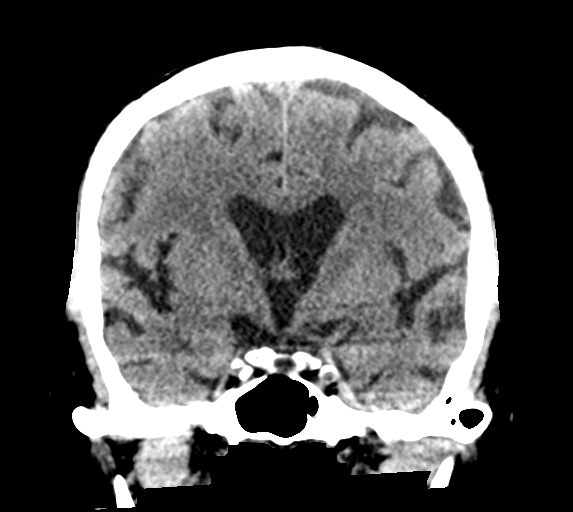

[Series 6: sagittal soft tissue · sagittal · 0.36mm/px · 3 of 54 slices shown]
[im 18/54  brain]
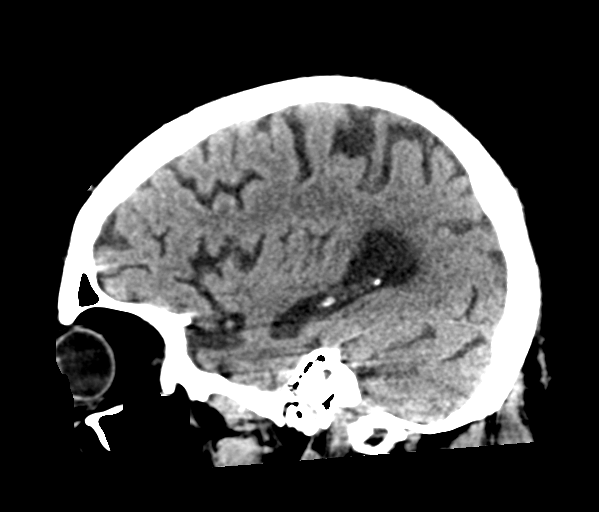
[im 27/54  brain]
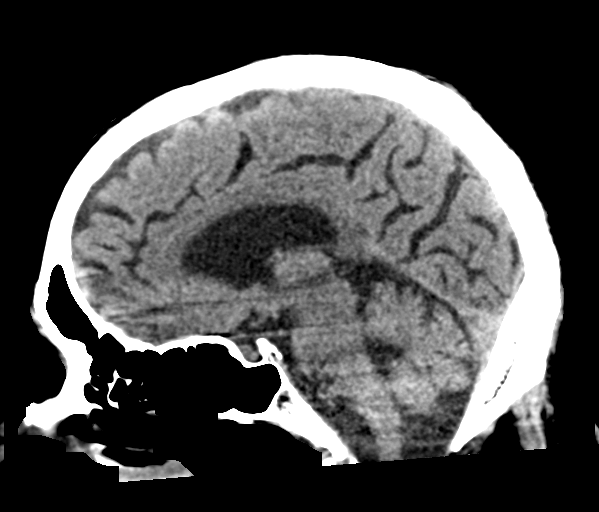
[im 36/54  brain]
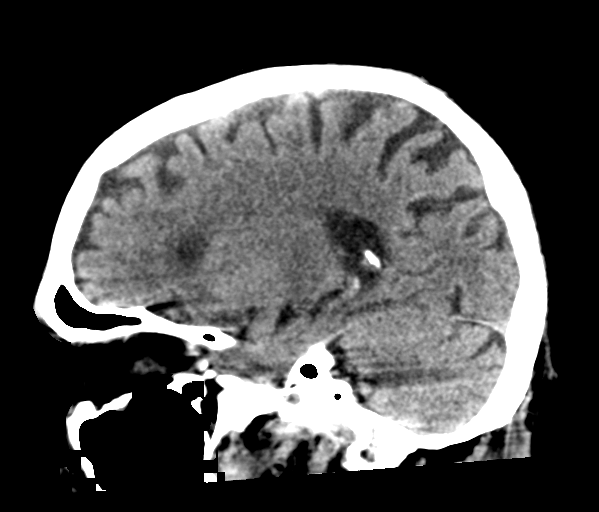

[15 of 47 positions shown; findings below may reference images not displayed]

FINDINGS: BRAIN: The ventricles and sulci are normal for age. No
intraparenchymal hemorrhage, mass effect nor midline shift. Patchy
supratentorial white matter hypodensities less than expected for
patient's age, though non-specific are most compatible with chronic
small vessel ischemic disease. No acute large vascular territory
infarcts. No abnormal extra-axial fluid collections. Basal cisterns
are patent.

VASCULAR: Mild to moderate calcific atherosclerosis of the carotid
siphons.

SKULL: No skull fracture. No significant scalp soft tissue swelling.

SINUSES/ORBITS: The mastoid air-cells and included paranasal sinuses
are well-aerated. Status post bilateral ocular lens implants. The
included ocular globes and orbital contents are non-suspicious.

OTHER: Patient is edentulous.
IMPRESSION: Negative CT HEAD for age.

## 2018-09-14 IMAGING — DX DG CHEST 1V
1 series · 2 of 2 positions shown · non-contrast
Comparison: CT chest yesterday. Chest x-rays 05/30/2016 and
earlier.

CLINICAL DATA: [AGE] with acute onset of fever.

EXAM:
Portable CHEST 1 VIEW

[Series 1: chest ap · 0.14mm/px · 2 of 2 slices shown]
[im 1/2]
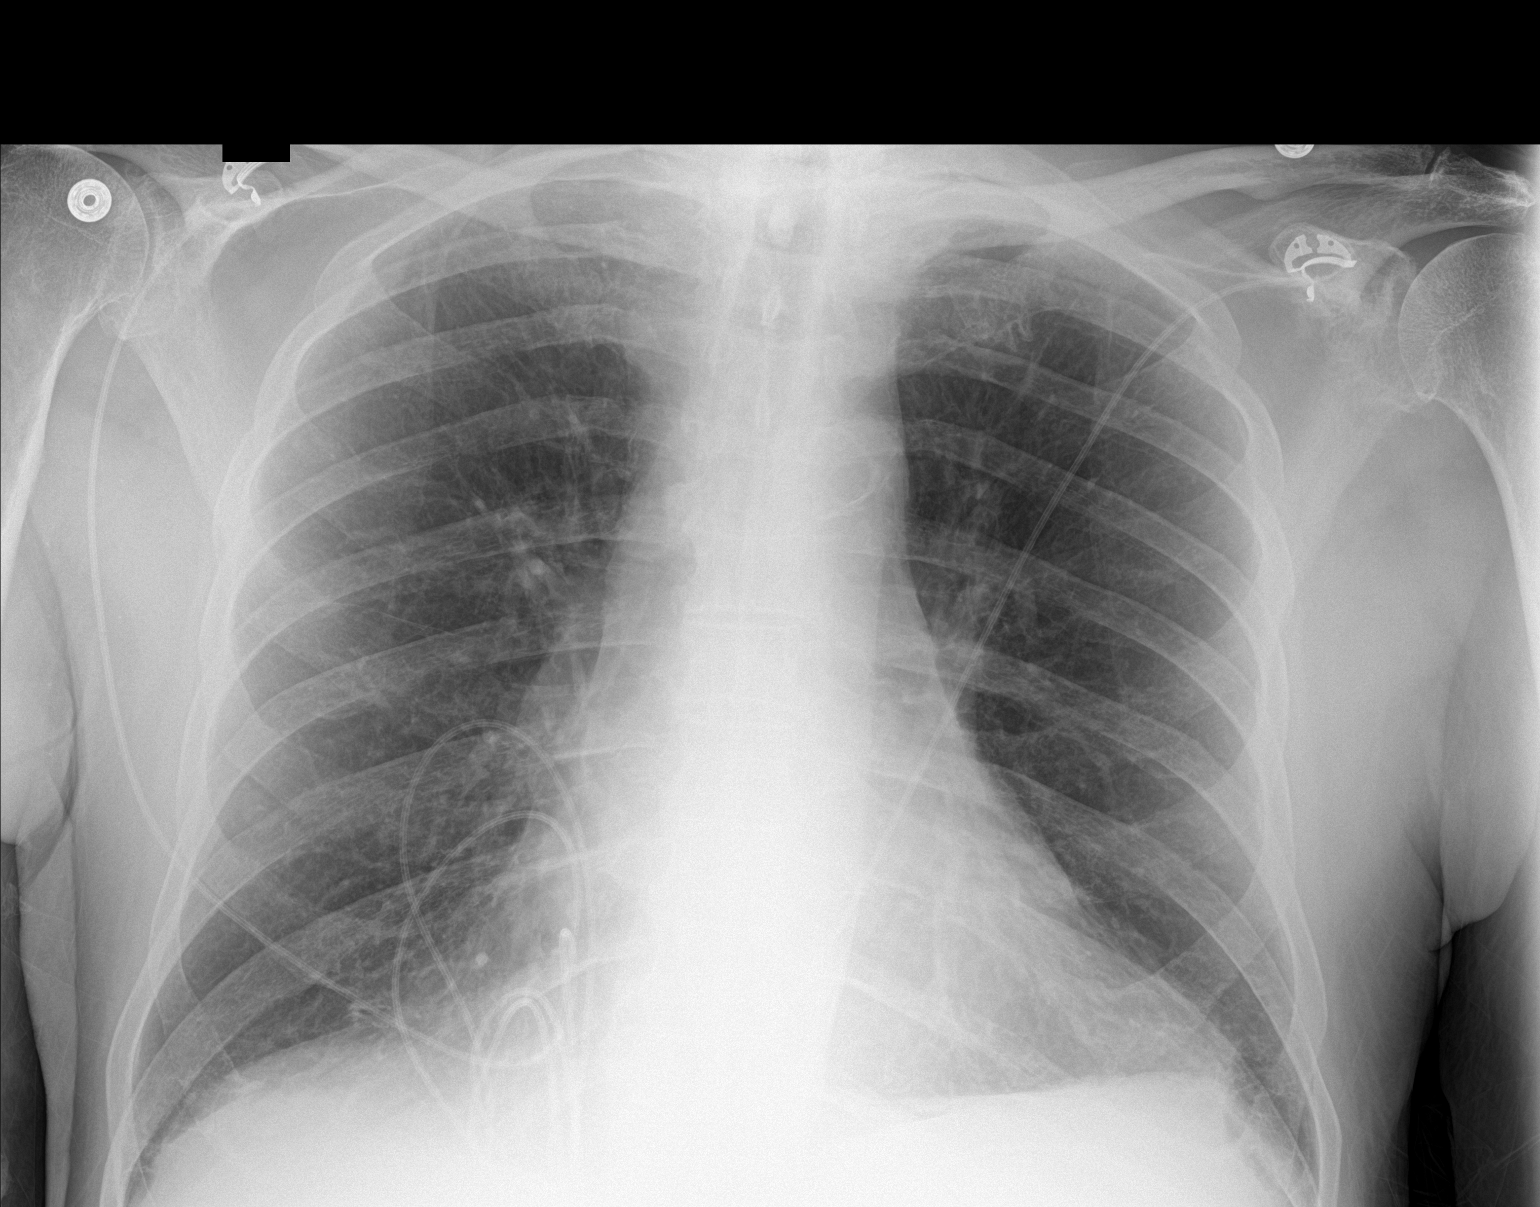
[im 2/2]
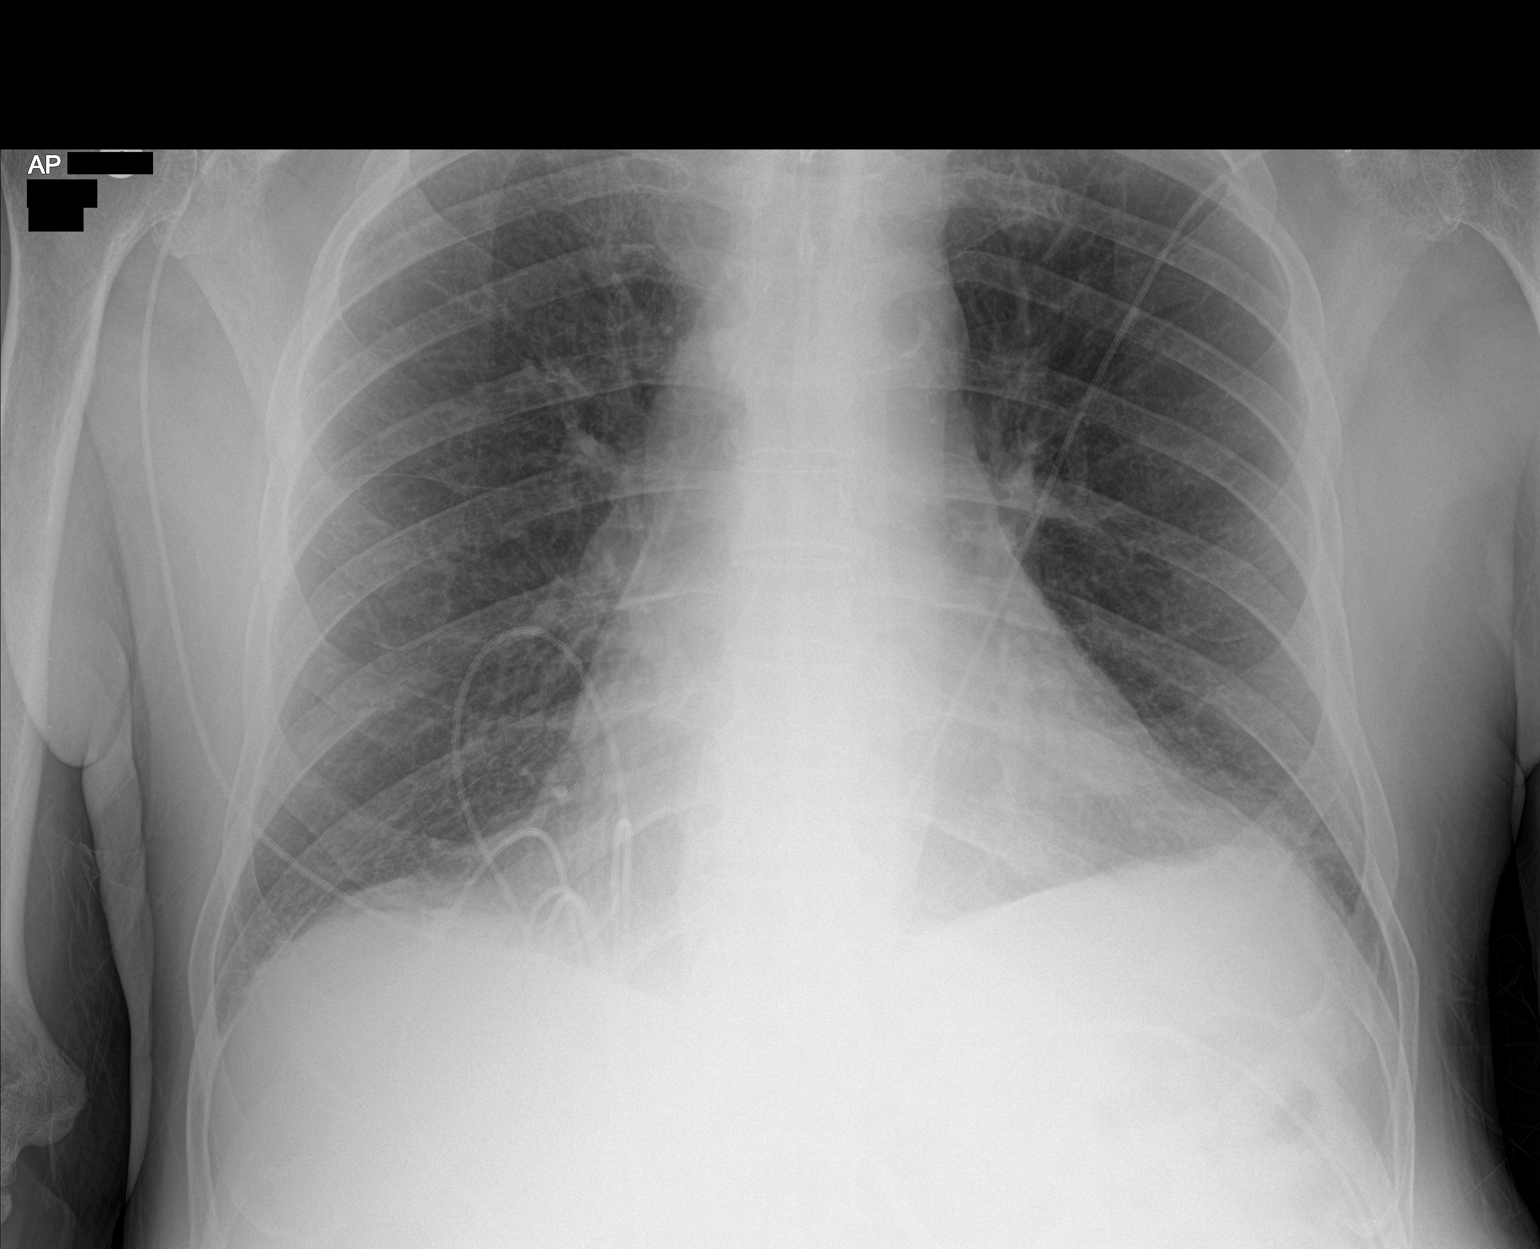

[2 of 2 positions shown; findings below may reference images not displayed]

FINDINGS: Cardiac silhouette upper normal in size to slightly enlarged for AP
portable technique, unchanged. Thoracic aorta atherosclerotic. Hilar
and mediastinal contours otherwise unremarkable. Lungs clear.
Bronchovascular markings normal. Pulmonary vascularity normal. No
visible pleural effusions. No pneumothorax.
IMPRESSION: No acute cardiopulmonary disease.
